# Patient Record
Sex: Female | Born: 1975 | Race: Black or African American | Hispanic: No | Marital: Single | Smoking: Current some day smoker
Health system: Southern US, Community
[De-identification: ages and names within clinical notes are randomized; demographics above are authoritative.]

## PROBLEM LIST (undated history)

## (undated) DIAGNOSIS — K859 Acute pancreatitis without necrosis or infection, unspecified: Secondary | ICD-10-CM

---

## 2004-12-21 ENCOUNTER — Emergency Department (HOSPITAL_COMMUNITY): Admission: EM | Admit: 2004-12-21 | Discharge: 2004-12-21 | Payer: Self-pay | Admitting: Emergency Medicine

## 2005-03-14 ENCOUNTER — Ambulatory Visit (HOSPITAL_COMMUNITY): Admission: RE | Admit: 2005-03-14 | Discharge: 2005-03-14 | Payer: Self-pay | Admitting: *Deleted

## 2005-04-09 ENCOUNTER — Other Ambulatory Visit: Admission: RE | Admit: 2005-04-09 | Discharge: 2005-04-09 | Payer: Self-pay | Admitting: Obstetrics and Gynecology

## 2005-08-03 ENCOUNTER — Inpatient Hospital Stay (HOSPITAL_COMMUNITY): Admission: AD | Admit: 2005-08-03 | Discharge: 2005-08-05 | Payer: Self-pay | Admitting: Obstetrics and Gynecology

## 2005-09-24 ENCOUNTER — Other Ambulatory Visit: Admission: RE | Admit: 2005-09-24 | Discharge: 2005-09-24 | Payer: Self-pay | Admitting: Obstetrics and Gynecology

## 2008-06-11 ENCOUNTER — Emergency Department (HOSPITAL_COMMUNITY): Admission: EM | Admit: 2008-06-11 | Discharge: 2008-06-11 | Payer: Self-pay | Admitting: Emergency Medicine

## 2009-01-27 ENCOUNTER — Emergency Department (HOSPITAL_COMMUNITY): Admission: EM | Admit: 2009-01-27 | Discharge: 2009-01-27 | Payer: Self-pay | Admitting: Emergency Medicine

## 2010-08-03 ENCOUNTER — Emergency Department (HOSPITAL_COMMUNITY): Admission: EM | Admit: 2010-08-03 | Discharge: 2010-08-03 | Payer: Self-pay | Admitting: Emergency Medicine

## 2011-02-23 NOTE — Op Note (Signed)
NAMEPIETRINA, JAGODZINSKI             ACCOUNT NO.:  [id number]   MEDICAL RECORD NO.:  [id number]          PATIENT TYPE:  INP   LOCATION:  9123                          FACILITY:  [facility]   PHYSICIAN:  Charles A. Delcambre, MDDATE OF BIRTH:  September 15, 1976   DATE OF PROCEDURE:  08/03/2005  DATE OF DISCHARGE:                                 OPERATIVE REPORT   This patient was admitted with Dr. Ashley Royalty today, see history and physical  as noted in the chart. A 35 year old, gravida 1, para 0 at 40 weeks and 1  day estimated gestational age, group B strep positive on penicillin,  throughout the day on pitocin after spontaneous rupture of membranes. She  was 3-4 cm dilated upon admission this morning as I recall per Dr. Ashley Royalty,  approximately 8 o'clock this morning. She had pitocin started and progressed  on slowly into labor to the active phase becoming 5-6 cm dilated by 1530.  She thereafter became 8 cm dilated by 1705 and then completed dilated by  1845. Throughout the day, she had several times where she had bradycardic  episodes down into the 80-90 range, at one point for an extended period of  time 8-10 minutes as reported to me by Dr. Ashley Royalty and then another episode  after the epidural was placed. However after recovery, fetal heart rate was  reactive and reassuring on both occasions. She had one time upon my  visualization when she had a vomiting episode of 5-6 cm at 1530 that she had  several variables. These resolved with O2. Reactive fetal heart rate was  noted once again with Pitocin off and O2. IV fluid, position changes, with  reassuring fetal heart rate once again. Pitocin was restarted. Pitocin was  advanced to __________ per minute maximum. She had adequate __________ units  greater than 200 noted and was progressed on to complete dilation as noted  above. After completion dilation at 1826, pushed to delivery at 1940.  Spontaneous vaginal delivery of vigorous female. Delivery  complicated by a  very tight nuchal cord necessitating cutting the cord prior to delivery of  the shoulders. The father of the baby was able to cut the cord quickly and  delivery was accomplished without difficulty. An intact perineum was noted,  placenta was spontaneous, three-vessel intact. Estimated blood loss less  than 300 mL, no repair was required. Mother and baby recovering stably at  this time. Apgar's were 5 and 9, baby was female and vigorous with  stimulation. Arterial blood gas was 7.24 and cord venous blood gas 7.33.      Charles A. Sydnee Cabal, MD  Electronically Signed     CAD/MEDQ  D:  08/03/2005  T:  08/04/2005  Job:  [id number]

## 2011-04-09 DIAGNOSIS — N943 Premenstrual tension syndrome: Secondary | ICD-10-CM | POA: Insufficient documentation

## 2011-04-09 DIAGNOSIS — L709 Acne, unspecified: Secondary | ICD-10-CM | POA: Insufficient documentation

## 2011-06-14 DIAGNOSIS — H6120 Impacted cerumen, unspecified ear: Secondary | ICD-10-CM | POA: Insufficient documentation

## 2011-06-21 DIAGNOSIS — A749 Chlamydial infection, unspecified: Secondary | ICD-10-CM | POA: Insufficient documentation

## 2011-06-21 DIAGNOSIS — E559 Vitamin D deficiency, unspecified: Secondary | ICD-10-CM | POA: Insufficient documentation

## 2011-06-21 DIAGNOSIS — A5909 Other urogenital trichomoniasis: Secondary | ICD-10-CM | POA: Insufficient documentation

## 2014-06-09 ENCOUNTER — Emergency Department (HOSPITAL_COMMUNITY): Payer: Medicaid Other

## 2014-06-09 ENCOUNTER — Emergency Department (HOSPITAL_COMMUNITY)
Admission: EM | Admit: 2014-06-09 | Discharge: 2014-06-09 | Disposition: A | Discharge status: Home / Self Care | Payer: Medicaid Other | Attending: Emergency Medicine | Admitting: Emergency Medicine

## 2014-06-09 ENCOUNTER — Encounter (HOSPITAL_COMMUNITY): Payer: Self-pay | Admitting: Emergency Medicine

## 2014-06-09 DIAGNOSIS — M545 Low back pain, unspecified: Secondary | ICD-10-CM | POA: Diagnosis not present

## 2014-06-09 DIAGNOSIS — Z791 Long term (current) use of non-steroidal anti-inflammatories (NSAID): Secondary | ICD-10-CM | POA: Diagnosis not present

## 2014-06-09 DIAGNOSIS — Z3202 Encounter for pregnancy test, result negative: Secondary | ICD-10-CM | POA: Insufficient documentation

## 2014-06-09 DIAGNOSIS — M5489 Other dorsalgia: Secondary | ICD-10-CM

## 2014-06-09 LAB — URINALYSIS, ROUTINE W REFLEX MICROSCOPIC
Bilirubin Urine: NEGATIVE
Glucose, UA: NEGATIVE mg/dL
Hgb urine dipstick: NEGATIVE
Ketones, ur: NEGATIVE mg/dL
Leukocytes, UA: NEGATIVE
Nitrite: NEGATIVE
Protein, ur: NEGATIVE mg/dL
Specific Gravity, Urine: 1.02 (ref 1.005–1.030)
Urobilinogen, UA: 0.2 mg/dL (ref 0.0–1.0)
pH: 7.5 (ref 5.0–8.0)

## 2014-06-09 LAB — PREGNANCY, URINE: Preg Test, Ur: NEGATIVE

## 2014-06-09 MED ORDER — NAPROXEN 500 MG PO TABS: 500.0000 mg | ORAL_TABLET | Freq: Two times a day (BID) | ORAL | Status: DC | PRN

## 2014-06-09 NOTE — ED Notes (Signed)
Patient states she has had lower Left back pain for the last month with sudden onset. Patient states she has been using pillows to sleep at night. Patient denise taking any mediations for the pain. Patient states her pain was 7/10. Patient states no loss of bowel or bladder.

## 2014-06-09 NOTE — ED Notes (Addendum)
Pt threatening to leave because she is "cold and tried of waiting."  Pt educated on delay, given a warm blanket, and agreed to go back to room.  Reports urine sample is in her room.  RN notified.

## 2014-06-09 NOTE — Discharge Instructions (Signed)
Your back pain could be musculoskeletal or gastrointestinal. Use naprosyn as directed to help with pain, and find a regular doctor to have ongoing management of your back pain. Return to the ER for changes or worsening symptoms.   Pain of Unknown Etiology (Pain Without a Known Cause) You have come to your caregiver because of pain. Pain can occur in any part of the body. Often there is not a definite cause. If your laboratory (blood or urine) work was normal and X-rays or other studies were normal, your caregiver may treat you without knowing the cause of the pain. An example of this is the headache. Most headaches are diagnosed by taking a history. This means your caregiver asks you questions about your headaches. Your caregiver determines a treatment based on your answers. Usually testing done for headaches is normal. Often testing is not done unless there is no response to medications. Regardless of where your pain is located today, you can be given medications to make you comfortable. If no physical cause of pain can be found, most cases of pain will gradually leave as suddenly as they came.  If you have a painful condition and no reason can be found for the pain, it is important that you follow up with your caregiver. If the pain becomes worse or does not go away, it may be necessary to repeat tests and look further for a possible cause.  Only take over-the-counter or prescription medicines for pain, discomfort, or fever as directed by your caregiver.  For the protection of your privacy, test results cannot be given over the phone. Make sure you receive the results of your test. Ask how these results are to be obtained if you have not been informed. It is your responsibility to obtain your test results.  You may continue all activities unless the activities cause more pain. When the pain lessens, it is important to gradually resume normal activities. Resume activities by beginning slowly and  gradually increasing the intensity and duration of the activities or exercise. During periods of severe pain, bed rest may be helpful. Lie or sit in any position that is comfortable.  Ice used for acute (sudden) conditions may be effective. Use a large plastic bag filled with ice and wrapped in a towel. This may provide pain relief.  See your caregiver for continued problems. Your caregiver can help or refer you for exercises or physical therapy if necessary. If you were given medications for your condition, do not drive, operate machinery or power tools, or sign legal documents for 24 hours. Do not drink alcohol, take sleeping pills, or take other medications that may interfere with treatment. See your caregiver immediately if you have pain that is becoming worse and not relieved by medications. Document Released: 06/19/2001 Document Revised: 07/15/2013 Document Reviewed: 09/24/2005 [facility] Patient Information 2015 [place], [place]. This information is not intended to replace advice given to you by your health care provider. Make sure you discuss any questions you have with your health care provider.  Musculoskeletal Pain Musculoskeletal pain is muscle and boney aches and pains. These pains can occur in any part of the body. Your caregiver may treat you without knowing the cause of the pain. They may treat you if blood or urine tests, X-rays, and other tests were normal.  CAUSES There is often not a definite cause or reason for these pains. These pains may be caused by a type of germ (virus). The discomfort may also come from overuse. Overuse includes  working out too hard when your body is not fit. Boney aches also come from weather changes. Bone is sensitive to atmospheric pressure changes. HOME CARE INSTRUCTIONS   Ask when your test results will be ready. Make sure you get your test results.  Only take over-the-counter or prescription medicines for pain, discomfort, or fever as directed by your  caregiver. If you were given medications for your condition, do not drive, operate machinery or power tools, or sign legal documents for 24 hours. Do not drink alcohol. Do not take sleeping pills or other medications that may interfere with treatment.  Continue all activities unless the activities cause more pain. When the pain lessens, slowly resume normal activities. Gradually increase the intensity and duration of the activities or exercise.  During periods of severe pain, bed rest may be helpful. Lay or sit in any position that is comfortable.  Putting ice on the injured area.  Put ice in a bag.  Place a towel between your skin and the bag.  Leave the ice on for 15 to 20 minutes, 3 to 4 times a day.  Follow up with your caregiver for continued problems and no reason can be found for the pain. If the pain becomes worse or does not go away, it may be necessary to repeat tests or do additional testing. Your caregiver may need to look further for a possible cause. SEEK IMMEDIATE MEDICAL CARE IF:  You have pain that is getting worse and is not relieved by medications.  You develop chest pain that is associated with shortness or breath, sweating, feeling sick to your stomach (nauseous), or throw up (vomit).  Your pain becomes localized to the abdomen.  You develop any new symptoms that seem different or that concern you. MAKE SURE YOU:   Understand these instructions.  Will watch your condition.  Will get help right away if you are not doing well or get worse. Document Released: 09/24/2005 Document Revised: 12/17/2011 Document Reviewed: 05/29/2013 [facility] Patient Information 2015 [place], [place]. This information is not intended to replace advice given to you by your health care provider. Make sure you discuss any questions you have with your health care provider.  Back Pain, Adult Low back pain is very common. About 1 in 5 people have back pain.The cause of low back pain is rarely  dangerous. The pain often gets better over time.About half of people with a sudden onset of back pain feel better in just 2 weeks. About 8 in 10 people feel better by 6 weeks.  CAUSES Some common causes of back pain include:  Strain of the muscles or ligaments supporting the spine.  Wear and tear (degeneration) of the spinal discs.  Arthritis.  Direct injury to the back. DIAGNOSIS Most of the time, the direct cause of low back pain is not known.However, back pain can be treated effectively even when the exact cause of the pain is unknown.Answering your caregiver's questions about your overall health and symptoms is one of the most accurate ways to make sure the cause of your pain is not dangerous. If your caregiver needs more information, he or she may order lab work or imaging tests (X-rays or MRIs).However, even if imaging tests show changes in your back, this usually does not require surgery. HOME CARE INSTRUCTIONS For many people, back pain returns.Since low back pain is rarely dangerous, it is often a condition that people can learn to [facility] their own.   Remain active. It is stressful on the  back to sit or stand in one place. Do not sit, drive, or stand in one place for more than 30 minutes at a time. Take short walks on level surfaces as soon as pain allows.Try to increase the length of time you walk each day.  Do not stay in bed.Resting more than 1 or 2 days can delay your recovery.  Do not avoid exercise or work.Your body is made to move.It is not dangerous to be active, even though your back may hurt.Your back will likely heal faster if you return to being active before your pain is gone.  Pay attention to your body when you bend and lift. Many people have less discomfortwhen lifting if they bend their knees, keep the load close to their bodies,and avoid twisting. Often, the most comfortable positions are those that put less stress on your recovering back.  Find a  comfortable position to sleep. Use a firm mattress and lie on your side with your knees slightly bent. If you lie on your back, put a pillow under your knees.  Only take over-the-counter or prescription medicines as directed by your caregiver. Over-the-counter medicines to reduce pain and inflammation are often the most helpful.Your caregiver may prescribe muscle relaxant drugs.These medicines help dull your pain so you can more quickly return to your normal activities and healthy exercise.  Put ice on the injured area.  Put ice in a plastic bag.  Place a towel between your skin and the bag.  Leave the ice on for 15-20 minutes, 03-04 times a day for the first 2 to 3 days. After that, ice and heat may be alternated to reduce pain and spasms.  Ask your caregiver about trying back exercises and gentle massage. This may be of some benefit.  Avoid feeling anxious or stressed.Stress increases muscle tension and can worsen back pain.It is important to recognize when you are anxious or stressed and learn ways to manage it.Exercise is a great option. SEEK MEDICAL CARE IF:  You have pain that is not relieved with rest or medicine.  You have pain that does not improve in 1 week.  You have new symptoms.  You are generally not feeling well. SEEK IMMEDIATE MEDICAL CARE IF:   You have pain that radiates from your back into your legs.  You develop new bowel or bladder control problems.  You have unusual weakness or numbness in your arms or legs.  You develop nausea or vomiting.  You develop abdominal pain.  You feel faint. Document Released: 09/24/2005 Document Revised: 03/25/2012 Document Reviewed: 01/26/2014 [facility] Patient Information 2015 [place], [place]. This information is not intended to replace advice given to you by your health care provider. Make sure you discuss any questions you have with your health care provider.  Emergency Department Resource Guide 1) Find a Doctor and  Pay Out of Pocket Although you won't have to find out who is covered by your insurance plan, it is a good idea to ask around and get recommendations. You will then need to call the office and see if the doctor you have chosen will accept you as a new patient and what types of options they offer for patients who are self-pay. Some doctors offer discounts or will set up payment plans for their patients who do not have insurance, but you will need to ask so you aren't surprised when you get to your appointment.  2) Contact Your Local Health Department Not all health departments have doctors that can see patients  for sick visits, but many do, so it is worth a call to see if yours does. If you don't know where your local health department is, you can check in your phone book. The CDC also has a tool to help you locate your state's health department, and many state websites also have listings of all of their local health departments.  3) Find a Walk-in Clinic If your illness is not likely to be very severe or complicated, you may want to try a walk in clinic. These are popping up all over the country in pharmacies, drugstores, and shopping centers. They're usually staffed by nurse practitioners or physician assistants that have been trained to treat common illnesses and complaints. They're usually fairly quick and inexpensive. However, if you have serious medical issues or chronic medical problems, these are probably not your best option.  No Primary Care Doctor: - Call Health Connect at  [phone] - they can help you locate a primary care doctor that  accepts your insurance, provides certain services, etc. - Physician Referral Service- [phone]  Chronic Pain Problems: Organization         Address  Phone   Notes  [facility]  [phone] Patients need to be referred by their primary care doctor.   Medication Assistance: Organization         Address  Phone   Notes  [facility] Medication [facility] [address] [place] [place], [place] [address] [phone] --Must be a resident of [facility] -- Must have NO insurance coverage whatsoever (no Medicaid/ Medicare, etc.) -- The pt. MUST have a primary care doctor that directs their care regularly and follows them in the community   MedAssist  [phone]   [employer]  [phone]    Agencies that provide inexpensive medical care: Organization         Address  Phone   Notes  [facility]  [phone]   [facility]    [phone]   [facility] [address] [place], [place] [address] [phone]   [facility] [address] [place], [place] [phone]   Planned Parenthood    [phone]   [facility]    [phone]   [facility]  [address], [place] Phone:  [phone], Fax:  [phone] Hours of Operation:  9 am - 6 pm, M-F.  Also accepts Medicaid/Medicare and self-pay.  [facility]  [address], [place] Phone: [phone], Fax: [phone]. Hours of Operation:  8:30 am - 5:30 pm, M-F.  Also accepts Medicaid and self-pay.  [facility] [place] [address]ello, [place] Phone: [phone]   [facility] [address] Natasha Bence [place], [place] [phone], Ext. [phone] Mondays & Thursdays: 7-9 AM.  First 15 patients are seen on a first come, first serve basis.    Medicaid-accepting [facility] Providers:  Organization         Address  Phone   Notes  [facility] [address], [place] [phone] Also accepts self-pay patients.  [facility] [address] Laurell Josephs [place], [place]  [phone]   [facility] [address], [place] [phone]   [facility] [address]ara [place], [place] [phone])  [phone]   Renaye Rakers [address], [place]   [phone] Only accepts [place] patients after they have their name applied to their card.   Self-Pay (no insurance) in [facility]:  Organization         Address  Phone   Notes  Sickle Cell Patients, [facility] [address] [place], [place] [phone]   [facility] [address] [place], [place] [phone]   [facility] [place]  [address], [place] [phone]   [facility]/Dr. Osei-Bonsu  [address], [place] or [address], [place] [phone] Phone number for both [place] and [place] locations is the same.  [facility] [address] [place] [phone]   [facility] [address]arya [place], [place] or [address] [phone]   [facility] [address] [place] [phone], phone; [phone], fax Sees patients 1st and 3rd Saturday of every month.  Must not qualify for public or private insurance (i.e. Medicaid, Medicare, [place] Health Choice, Veterans' Benefits)  Household income should be no more than 200% of the poverty level The clinic cannot treat you if you are pregnant or think you are pregnant  Sexually transmitted diseases are not treated at the clinic.    Dental Care: Organization         Address  Phone  Notes  [facility] Department of [facility] [address] [place], [place] [phone] Accepts children up to age 64 who are enrolled in [place] or [place] Health Choice; pregnant women with a Medicaid card; and children who have applied for Medicaid or [employer] Health Choice, but were declined, whose parents can pay a reduced fee at time of service.  [facility] Department of [facility]  [address], [place] [phone] Accepts children up to age 6 who are enrolled in [place] or [place] Health Choice; pregnant women with a Medicaid card; and children who have applied for Medicaid or [place] Health Choice, but were declined, whose parents can pay a reduced fee at time of service.  [facility]  [address] [place], [place] [phone] Patients are seen by appointment only. Walk-ins are not accepted. [facility] will see patients 59 years of age and older. Monday - Tuesday (8am-5pm) Most Wednesdays (8:30-5pm) $30 per visit, cash only  [facility]  [address], [facility] [phone] Patients are seen by appointment only. Walk-ins are not accepted. [facility] will see patients 63 years of age and older. One Wednesday Evening (Monthly: Volunteer Based).  $30 per visit, cash only  [employer]  [phone] for adults; Children under age 35, call Graduate Pediatric Dentistry at [phone]. Children aged 17-14, please call [phone] to request a pediatric application.  Dental services are provided in all areas of dental care including fillings, crowns and bridges, complete and partial dentures, implants, gum treatment, root canals, and extractions. Preventive care is also provided. Treatment is provided to both adults and children. Patients are selected via a lottery and there is often a waiting list.   [facility] [address] [place]  [phone] [email or web address]   [facility] [address] [place], [place] (  [phone], Ext. [phone] Second and Fourth Thursday of each month, opens at 6:30 AM; Clinic ends at 9 AM.  Patients are seen on a first-come first-served basis, and a limited number are seen during each clinic.   [facility]  [address] Ether Griffins [place], [place] [phone]   Eligibility Requirements You must have lived in [place], [place], or  [place] counties for at least the last three months.   You cannot be eligible for state or federal sponsored [place], including [employer], [place], or [employer].   You generally cannot be eligible for healthcare insurance through your employer.    How to apply: Eligibility screenings are held every Tuesday and Wednesday afternoon from 1:00 pm until 4:00 pm. You do not need an appointment for the interview!  [facility] [address]rica Thorman, [place], [place] [phone]   [facility] Health Department  [phone]   [facility] Health Department  [phone]   [facility] Health Department  [phone]    Behavioral Health Resources in the Community: Intensive Outpatient Programs Organization         Address  Phone  Notes  [facility] [address]az [address], [place], [place] [phone]   [facility] [address], [place], [place] [phone]   [facility] [address], [place], [place]  [phone]   [facility] Mental Health [address]  [place], [place] [phone] or [phone]   Substance Abuse Resources Organization         Address  Phone  Notes  [facility]  [phone]   [facility]  [phone]   [facility] [place]  [phone]   Floydene Flock  [phone]   Residential & Outpatient Substance Abuse Program  [phone]   Psychological Services Organization         Address  Phone  Notes  [facility]  [phone]   [facility]  [phone]   [facility] [address] [place] [phone] or [phone]    Mobile Crisis Teams Organization         Address  Phone  Notes  [facility], Mobile Crisis Care Unit  [phone]   [facility]  [address] [place], [place] [phone]   Doristine Locks [address] [place] [phone]    Self-Help/Support Groups Organization         Address  Phone             Notes  [facility] [place] - variety of support groups  336- I7437963 Call for more information  Narcotics Anonymous (NA), [facility] [address], [employer] [place]  2 meetings at this location   Statistician         Address  Phone  Notes  [facility] [address],    [place]  [phone]   [facility]  [address] [place] [address], [place], [place] [phone]   [facility] [address] [place], [place] [phone] Admissions: 8am-3pm M-F  [facility] [address],    [place], [place] [phone]   [facility] [address] Starling Manns [place], [place] [phone]   [facility] [address]  [place], [place] [phone]   [facility] - Intensive Outpatient [address]ure[address], [place], [place] [phone]   [facility] (Addiction  [facility] [address]  [place], [place] [phone] or [phone]   [facility] ([facility]) [address] [place], [place] [phone] Accepts Medicaid  [facility] [place] [address]  [place] [phone] Substance Abuse/Addiction Treatment   [facility] Organization         Address  Phone  Notes  [facility]  [phone]   Angie Fava, PhD [address] [place], [place]   [phone] or [phone]   [facility] Behavioral   [address] [place], [place] [phone]   [facility] [address], [place], [place] [phone] Insurance/Medicaid/sponsorship through [facility] [address]                                    [place], [place] [phone] Therapy/tele-psych/case  [facility] [address], [place] [phone]     Dr. Lolly Mustache  [phone]   [facility]  [employer] [facility] Dept. 1) [address] [place], [place] 2) [address] [place] 3)  [address], [place] [phone]  [phone]   [facility] Child Abuse Hotline 949-654-6679 or [phone] (After Hours)

## 2014-06-09 NOTE — ED Provider Notes (Signed)
CSN: [id number]     Arrival date & time 06/09/14  1352 History  This chart was scribed for non-physician practitioner, Allen Derry, PA-C working with Purvis Sheffield, MD by Greggory Stallion, ED scribe. This patient was seen in room WTR9/WTR9 and the patient's care was started at 3:07 PM.    Chief Complaint  Patient presents with  . Back Pain   Patient is a 38 y.o. female presenting with back pain. The history is provided by the patient. No language interpreter was used.  Back Pain Location:  Lumbar spine Quality: squeezing. Radiates to:  Does not radiate Pain severity:  Moderate Worse during: worse before bowel movement. Onset quality:  Sudden Duration:  1 month Timing:  Constant Progression:  Worsening Chronicity:  New Relieved by: pillow. Worsened by:  Sitting Ineffective treatments:  None tried Associated symptoms: no abdominal pain, no chest pain, no dysuria, no fever and no numbness    HPI Comments: Sydney Dalton is a 38 y.o. female who presents to the Emergency Department complaining of worsening left lower back pain that started one month ago. Denies injury. Rates pain 7/10 and describes it as squeezing. Pain does not radiate. Sitting down worsens the pain. Reports darker urine and states pain is worse before she has to have a bowel movement. She has bowel movements twice per day everyday. Pt has not taken anything for her symptoms but states she uses a pillow behind her back with some relief. Denies fever, unexpected weight loss, chest pain, SOB, abdominal pain, nausea, emesis, diarrhea, constipation, blood in stool, black tarry stool, dysuria, hematuria, vaginal discharge, vaginal bleeding, numbness or tingling in extremities or cauda equina symptoms. LMP was 05/24/14.  History reviewed. No pertinent past medical history. History reviewed. No pertinent past surgical history. No family history on file. History  Substance Use Topics  . Smoking status: Never Smoker    . Smokeless tobacco: Not on file  . Alcohol Use: Yes   OB History   Grav Para Term Preterm Abortions TAB SAB Ect Mult Living                 Review of Systems  Constitutional: Negative for fever and unexpected weight change.  Respiratory: Negative for shortness of breath.   Cardiovascular: Negative for chest pain.  Gastrointestinal: Negative for nausea, vomiting, abdominal pain, diarrhea, constipation and blood in stool.  Genitourinary: Negative for dysuria, hematuria, vaginal bleeding and vaginal discharge.  Musculoskeletal: Positive for back pain.  Neurological: Negative for numbness.  10 Systems reviewed and are negative for acute change except as noted in the HPI.  Allergies  Review of patient's allergies indicates no known allergies.  Home Medications   Prior to Admission medications   Medication Sig Start Date End Date Taking? Authorizing Provider  naproxen (NAPROSYN) 500 MG tablet Take 1 tablet (500 mg total) by mouth 2 (two) times daily as needed for mild pain, moderate pain or headache (TAKE WITH MEALS.). 06/09/14   Donnita Falls Camprubi-Soms, PA-C   BP 114/85  Pulse 76  Temp(Src) 98.5 F (36.9 C) (Oral)  Resp 16  Ht  (1.651 m)  Wt 160 lb (72.576 kg)  BMI 26.63 kg/m2  SpO2 100%  LMP 05/24/2014  Physical Exam  Nursing note and vitals reviewed. Constitutional: She is oriented to person, place, and time. Vital signs are normal. She appears well-developed and well-nourished. No distress.  Afebrile, nontoxic  HENT:  Head: Normocephalic and atraumatic.  Mouth/Throat: Mucous membranes are normal.  Eyes: Conjunctivae and  EOM are normal.  Neck: Normal range of motion. Neck supple. No spinous process tenderness and no muscular tenderness present. No rigidity. Normal range of motion present.  FROM without TTP  Cardiovascular: Normal rate, regular rhythm, normal heart sounds and intact distal pulses.  Exam reveals no gallop and no friction rub.   No murmur  heard. Pulmonary/Chest: Effort normal and breath sounds normal. No respiratory distress. She has no decreased breath sounds. She has no wheezes. She has no rhonchi. She has no rales.  Abdominal: Soft. Normal appearance and bowel sounds are normal. She exhibits no distension. There is no splenomegaly. There is no tenderness. There is no rigidity, no rebound, no guarding, no CVA tenderness, no tenderness at McBurney's point and negative Murphy's sign.  Soft, NT/ND, +BS throughout, no r/g/r, neg murphy's, neg mcburney's, no splenomegaly palpated  Musculoskeletal: Normal range of motion.       Back:  MAE x4. All spinal levels nonTTP without paraspinous muscle TTP. Sensation and strength at baseline. Gait nonataxic and nonantalgic. No area of reproducible pain. No deformity or swelling to skin over painful area  Neurological: She is alert and oriented to person, place, and time. She has normal strength. No sensory deficit. Gait normal.  Skin: Skin is warm, dry and intact. No rash noted.  Psychiatric: She has a normal mood and affect. Her behavior is normal.  10 Systems reviewed and are negative for acute change except as noted in the HPI.   ED Course  Procedures (including critical care time)  DIAGNOSTIC STUDIES: Oxygen Saturation is 100% on RA, normal by my interpretation.    COORDINATION OF CARE: 3:14 PM-Discussed treatment plan which includes UA and xray with pt at bedside and pt agreed to plan.   Labs Review Labs Reviewed  URINALYSIS, ROUTINE W REFLEX MICROSCOPIC - Abnormal; Notable for the following:    APPearance CLOUDY (*)    All other components within normal limits  PREGNANCY, URINE    Imaging Review No results found.   EKG Interpretation None      MDM   Final diagnoses:  Other back pain    38y/o female with lower back pain x1 month. Doesn't seem musculoskeletal, is not reproducible. No GI complaint, but could be constipation or gas, although story doesn't completely  fit. Could be urological. Discussed obtaining U/A, upreg, and xray to eval stool burden and eval for obstruction although low likelihood. U/A showing no blood, upreg neg. Xray was very backed up today, and after several hours of waiting pt decided to leave. Discussed f/up with a PCP given that this was an unclear etiology but I didn't believe it needed further work up today. She was agreeable with this plan. Given naprosyn for pain. Stable at time of d/c. Given return precautions.  I personally performed the services described in this documentation, which was scribed in my presence. The recorded information has been reviewed and is accurate.  BP 114/85  Pulse 76  Temp(Src) 98.5 F (36.9 C) (Oral)  Resp 16  Ht  (1.651 m)  Wt 160 lb (72.576 kg)  BMI 26.63 kg/m2  SpO2 100%  LMP 05/24/2014  Meds ordered this encounter  Medications  . naproxen (NAPROSYN) 500 MG tablet    Sig: Take 1 tablet (500 mg total) by mouth 2 (two) times daily as needed for mild pain, moderate pain or headache (TAKE WITH MEALS.).    Dispense:  20 tablet    Refill:  0    Order Specific Question:  Supervising Provider    Answer:  Eber Hong D [address] [place], [place] 06/09/14 [phone]

## 2014-06-10 NOTE — ED Provider Notes (Signed)
Medical screening examination/treatment/procedure(s) were performed by non-physician practitioner and as supervising physician I was immediately available for consultation/collaboration.   EKG Interpretation None        Purvis Sheffield, MD 06/10/14 [phone]

## 2016-02-29 ENCOUNTER — Emergency Department (HOSPITAL_COMMUNITY): Payer: Self-pay

## 2016-02-29 ENCOUNTER — Emergency Department (HOSPITAL_COMMUNITY)
Admission: EM | Admit: 2016-02-29 | Discharge: 2016-02-29 | Disposition: A | Discharge status: Home / Self Care | Payer: Self-pay | Attending: Emergency Medicine | Admitting: Emergency Medicine

## 2016-02-29 ENCOUNTER — Encounter (HOSPITAL_COMMUNITY): Payer: Self-pay | Admitting: Emergency Medicine

## 2016-02-29 DIAGNOSIS — R519 Headache, unspecified: Secondary | ICD-10-CM

## 2016-02-29 DIAGNOSIS — Y999 Unspecified external cause status: Secondary | ICD-10-CM | POA: Insufficient documentation

## 2016-02-29 DIAGNOSIS — Y929 Unspecified place or not applicable: Secondary | ICD-10-CM | POA: Insufficient documentation

## 2016-02-29 DIAGNOSIS — Y93K1 Activity, walking an animal: Secondary | ICD-10-CM | POA: Insufficient documentation

## 2016-02-29 DIAGNOSIS — S022XXA Fracture of nasal bones, initial encounter for closed fracture: Secondary | ICD-10-CM

## 2016-02-29 DIAGNOSIS — R51 Headache: Secondary | ICD-10-CM

## 2016-02-29 DIAGNOSIS — W0110XA Fall on same level from slipping, tripping and stumbling with subsequent striking against unspecified object, initial encounter: Secondary | ICD-10-CM | POA: Insufficient documentation

## 2016-02-29 LAB — POC URINE PREG, ED: PREG TEST UR: NEGATIVE

## 2016-02-29 NOTE — ED Notes (Signed)
Pt c/o swelling to her nose and headache since yesterday after tripping and falling on the stairs. Denies LOC and hitting her head.

## 2016-02-29 NOTE — Discharge Instructions (Signed)
Obtained a primary care provider and follow-up with your primary care provider within 2 days to have your nose reevaluated.   Return to the emergency department if you experience worsening headache, visual changes, nausea, vomiting, weakness or numbness.  Nasal Fracture A nasal fracture is a break or crack in the bones or cartilage of the nose. Minor breaks do not require treatment. These breaks usually heal on their own after about one month. Serious breaks may require surgery. CAUSES This injury is usually caused by a blunt injury to the nose. This type of injury often occurs from:  Contact sports.  Car accidents.  Falls.  Getting punched. SYMPTOMS Symptoms of this injury include:  Pain.  Swelling of the nose.  Bleeding from the nose.  Bruising around the nose or eyes. This may include having black eyes.  Crooked appearance of the nose. DIAGNOSIS This injury may be diagnosed with a physical exam. The health care provider will gently feel the nose for signs of broken bones. He or she will look inside the nostrils to make sure that there is not a blood-filled swelling on the dividing wall between the nostrils (septal hematoma). X-rays of the nose may not show a nasal fracture even when one is present. In some cases, X-rays or a CT scan may be done 1-5 days after the injury. Sometimes, the health care provider will want to wait until the swelling has gone down. TREATMENT Often, minor fractures that have caused no deformity do not require treatment. More serious fractures in which bones have moved out of position may require surgery, which will take place after the swelling is gone. Surgery will stabilize and align the fracture. In some cases, a health care provider may be able to reposition the bones without surgery. This may be done in the health care provider's office after medicine is given to numb the area (local anesthetic). HOME CARE INSTRUCTIONS  If directed, apply ice to the  injured area:  Put ice in a plastic bag.  Place a towel between your skin and the bag.  Leave the ice on for 20 minutes, 2-3 times per day.  Take over-the-counter and prescription medicines only as told by your health care provider.  If your nose starts to bleed, sit in an upright position while you squeeze the soft parts of your nose against the dividing wall between your nostrils (septum) for 10 minutes.  Try to avoid blowing your nose.  Return to your normal activities as told by your health care provider. Ask your health care provider what activities are safe for you.  Avoid contact sports for 3-4 weeks or as told by your health care provider.  Keep all follow-up visits as told by your health care provider. This is important. SEEK MEDICAL CARE IF:  Your pain increases or becomes severe.  You continue to have nosebleeds.  The shape of your nose does not return to normal within 5 days.  You have pus draining out of your nose. SEEK IMMEDIATE MEDICAL CARE IF:  You have bleeding from your nose that does not stop after you pinch your nostrils closed for 20 minutes and keep ice on your nose.  You have clear fluid draining out of your nose.  You notice a grape-like swelling on the septum. This swelling is a collection of blood (hematoma) that must be drained to help prevent infection.  You have difficulty moving your eyes.  You have repeated vomiting.   This information is not intended to replace  advice given to you by your health care provider. Make sure you discuss any questions you have with your health care provider.   Document Released: 09/21/2000 Document Revised: 06/15/2015 Document Reviewed: 11/01/2014 Elsevier Interactive Patient Education [employer]

## 2016-02-29 NOTE — ED Provider Notes (Signed)
CSN: [id number]     Arrival date & time 02/29/16  1107 History   First MD Initiated Contact with Patient 02/29/16 1141     Chief Complaint  Patient presents with  . Headache     (Consider location/radiation/quality/duration/timing/severity/associated sxs/prior Treatment) HPI   Patient is a [age] old female with no medical history who presents to the ED with nose pain. Patient states that last evening around 7 PM she was walking her dogs when they pulled her up the stairs she fell and hit her face on a step. She was unable to brace herself with her hands. She had a nosebleed for roughly 10 minutes. She has tried ice with little relief. She complains of a constant frontal headache, pressure, constant, 8/10 with associated eye pressure and pain. She is not taking anything for the pain. She states her nose is much more swollen today. She denies visual changes, numbness/tingling, weakness, nausea, vomiting, syncope, loss of consciousness.  History reviewed. No pertinent past medical history. History reviewed. No pertinent past surgical history. History reviewed. No pertinent family history. Social History  Substance Use Topics  . Smoking status: Never Smoker   . Smokeless tobacco: None  . Alcohol Use: Yes   OB History    No data available     Review of Systems  Constitutional: Negative for fever and chills.  HENT: Positive for facial swelling and nosebleeds. Negative for ear discharge, ear pain, rhinorrhea, sinus pressure, sore throat, tinnitus, trouble swallowing and voice change.   Eyes: Positive for pain. Negative for discharge, itching and visual disturbance.  Respiratory: Negative for chest tightness and shortness of breath.   Cardiovascular: Negative for chest pain.  Gastrointestinal: Negative for nausea, vomiting and abdominal pain.  Musculoskeletal: Negative for gait problem and neck pain.  Neurological: Positive for headaches. Negative for dizziness, syncope, weakness and  numbness.      Allergies  Review of patient's allergies indicates no known allergies.  Home Medications   Prior to Admission medications   Medication Sig Start Date End Date Taking? Authorizing Provider  Probiotic Product (PROBIOTIC PO) Take 1 capsule by mouth daily.   Yes Historical Provider, MD   BP 106/85 mmHg  Pulse 80  Temp(Src) 98.3 F (36.8 C) (Oral)  Resp 14  SpO2 98%  LMP 02/06/2016 (Approximate) Physical Exam   Constitutional: Pt is oriented to person, place, and time. Pt appears well-developed and well-nourished. No distress.  HENT:  Head: Normocephalic no contusions, erythema, ecchymosis noted.  Nose: Swelling noted to bridge of nose, turbinates are edematous and erythematous, no discharge noted. TTP Mouth/Throat: Oropharynx is clear and moist.  Eyes: Conjunctivae and EOM are normal. Pupils are equal, round, and reactive to light. No scleral icterus TTP surround orbit  No horizontal, vertical or rotational nystagmus  Neck: Normal range of motion. Neck supple.  Full active and passive ROM without pain No midline or paraspinal tenderness Cardiovascular: Normal rate, regular rhythm and intact distal pulses.   Pulmonary/Chest: Effort normal and breath sounds normal. No respiratory distress. Pt has no wheezes. No rales.  Abdominal: Soft. There is no tenderness. There is no rebound and no guarding.  Musculoskeletal: Normal range of motion.  Neurological: Pt. is alert and oriented to person, place, and time.No cranial nerve deficit.  Exhibits normal muscle tone. Coordination normal.  Mental Status:  Alert, oriented, thought content appropriate. Speech fluent without evidence of aphasia. Able to follow 2 step commands without difficulty.  Cranial Nerves:  II:  Peripheral visual fields grossly normal,  pupils equal, round, reactive to light III,IV, VI: ptosis not present, extra-ocular motions intact bilaterally  V,VII: smile symmetric, facial light touch sensation  equal VIII: hearing grossly normal bilaterally  IX,X: midline uvula rise  XI: bilateral shoulder shrug equal and strong XII: midline tongue extension  Motor:  5/5 in upper and lower extremities bilaterally including strong and equal grip strength and dorsiflexion/plantar flexion Sensory: light touch normal in all extremities.  Cerebellar: normal finger-to-nose with bilateral upper extremities, pronator drift negative  Gait: normal gait and balance Skin: Skin is warm and dry. No rash noted. Pt is not diaphoretic.  Psychiatric: Pt has a normal mood and affect. Behavior is normal. Judgment and thought content normal.  Nursing note and vitals reviewed.    ED Course  Procedures (including critical care time) Labs Review Labs Reviewed  POC URINE PREG, ED    Imaging Review Ct Head Wo Contrast  02/29/2016  CLINICAL DATA:  Fall onto the nose with swelling. Injury yesterday related to trip on stairs. Headache Initial encounter. EXAM: CT HEAD WITHOUT CONTRAST CT MAXILLOFACIAL WITHOUT CONTRAST TECHNIQUE: Multidetector CT imaging of the head and maxillofacial structures were performed using the standard protocol without intravenous contrast. Multiplanar CT image reconstructions of the maxillofacial structures were also generated. COMPARISON:  01/27/2009 face CT FINDINGS: CT HEAD FINDINGS Skull:Negative for calvarial fracture. Facial findings described below. Brain: Negative. No evidence of acute infarction, hemorrhage, hydrocephalus, or mass lesion/mass effect. CT MAXILLOFACIAL FINDINGS There is a minimally depressed right nasal bone fracture that is newly seen. Leftward septal deviation with left anterior nasal cavity stenosis is chronic and without visible septal fracture. The mandible is intact and located. No hemo sinus. No evidence of globe injury or postseptal hematoma. Subcutaneous nodule in the right cheek, usually dermal inclusion cyst. Carious maxillary and mandibular molars. The lower right  terminal molar has a notable periapical erosion. IMPRESSION: 1. Minimally depressed right nasal bone fracture. 2. No evidence of intracranial injury. 3. 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 Electronically Signed   By: Marnee Spring M.D.   On: 02/29/2016 13:19   Ct Maxillofacial Wo Cm  02/29/2016  CLINICAL DATA:  Fall onto the nose with swelling. Injury yesterday related to trip on stairs. Headache Initial encounter. EXAM: CT HEAD WITHOUT CONTRAST CT MAXILLOFACIAL WITHOUT CONTRAST TECHNIQUE: Multidetector CT imaging of the head and maxillofacial structures were performed using the standard protocol without intravenous contrast. Multiplanar CT image reconstructions of the maxillofacial structures were also generated. COMPARISON:  01/27/2009 face CT FINDINGS: CT HEAD FINDINGS Skull:Negative for calvarial fracture. Facial findings described below. Brain: Negative. No evidence of acute infarction, hemorrhage, hydrocephalus, or mass lesion/mass effect. CT MAXILLOFACIAL FINDINGS There is a minimally depressed right nasal bone fracture that is newly seen. Leftward septal deviation with left anterior nasal cavity stenosis is chronic and without visible septal fracture. The mandible is intact and located. No hemo sinus. No evidence of globe injury or postseptal hematoma. Subcutaneous nodule in the right cheek, usually dermal inclusion cyst. Carious maxillary and mandibular molars. The lower right terminal molar has a notable periapical erosion. IMPRESSION: 1. Minimally depressed right nasal bone fracture. 2. No evidence of intracranial injury. 3. 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 Electronically Signed   By: Marnee Spring M.D.   On: 02/29/2016 13:19   I have personally reviewed and evaluated these images and lab results as part of my medical decision-making.   EKG Interpretation None      MDM  Final diagnoses:  Nasal bone fracture, closed, initial  encounter  Nonintractable headache, unspecified chronicity pattern, unspecified headache type   Patient presents after a fall yesterday with nose pain and headache. Patient fell on her nose. No focal neurological deficits, no weakness, no visual changes, no rhinorrhea. CT revealed minimally depressed right nasal bone fracture. Discussed supportive measures with patient to include ice and NSAIDs. Discussed strict return precautions. Instructed patient to follow-up with her primary care provider. Patient expressed understanding to the discharge structures.    Jerre Simon, PA 02/29/16 1418  Benjiman Core, MD 02/29/16 1626

## 2017-10-01 IMAGING — CT CT HEAD W/O CM
3 of 5 series · 15 of 47 positions shown, 18 images · non-contrast
Comparison: 01/27/2009 face CT

CLINICAL DATA: Fall onto the nose with swelling. Injury yesterday
related to trip on stairs. Headache Initial encounter.

EXAM:
CT HEAD WITHOUT CONTRAST
CT MAXILLOFACIAL WITHOUT CONTRAST
TECHNIQUE: Multidetector CT imaging of the head and maxillofacial structures
were performed using the standard protocol without intravenous
contrast. Multiplanar CT image reconstructions of the maxillofacial
structures were also generated.

[Series 5: facial st · axial · 0.41mm/px · z∈[-244,-88]mm · 9 of 92 slices shown, 12 images]
[im 7/92  brain]
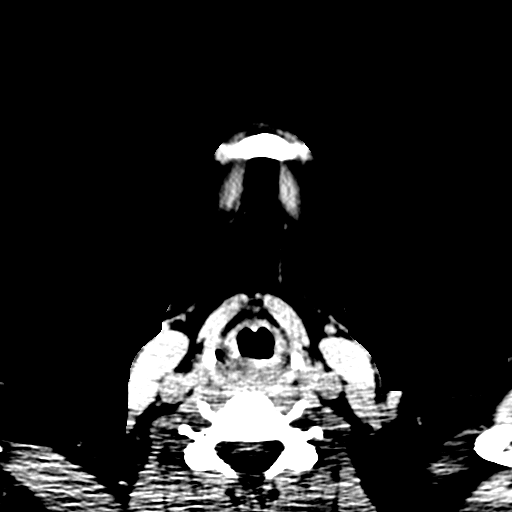
[im 7/92  bone]
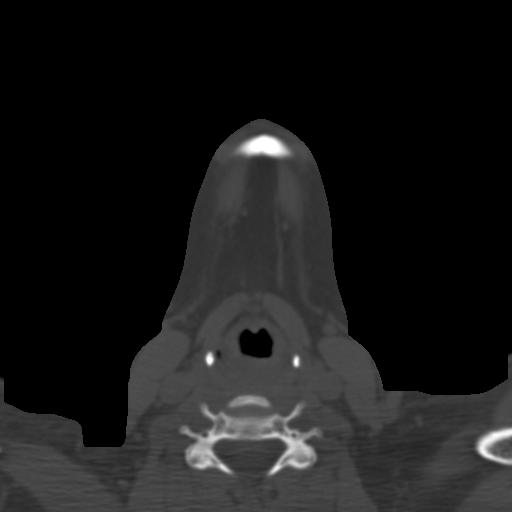
[im 19/92  brain]
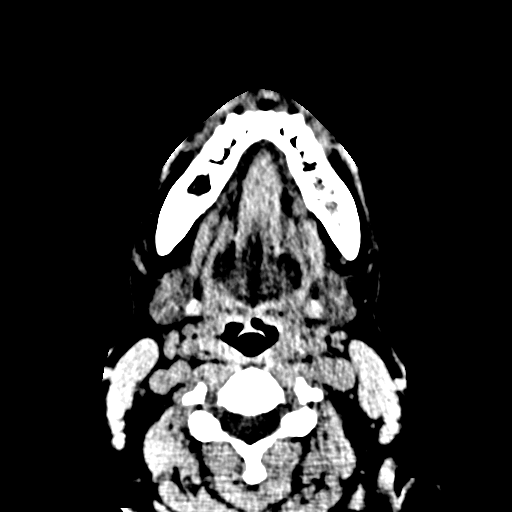
[im 25/92  brain]
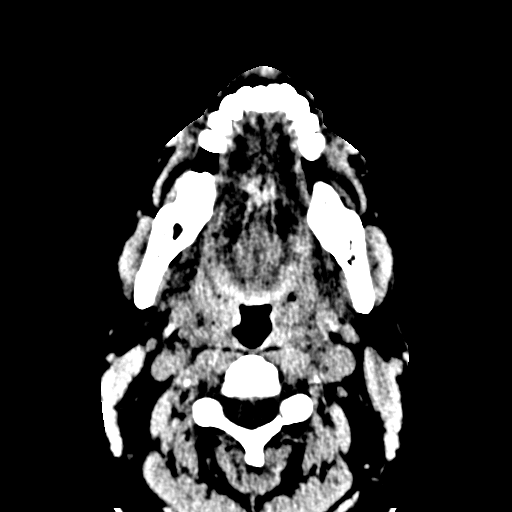
[im 37/92  brain]
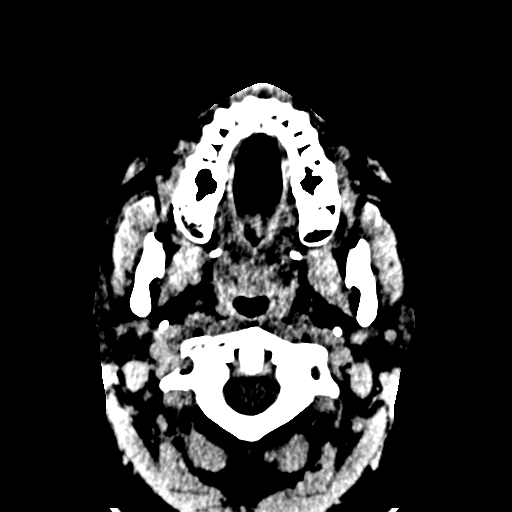
[im 49/92  brain]
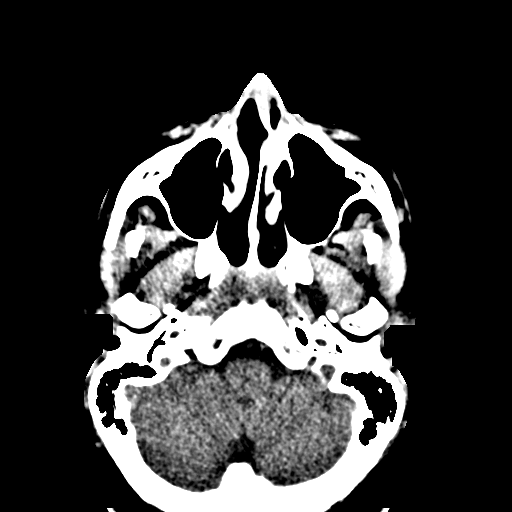
[im 49/92  bone]
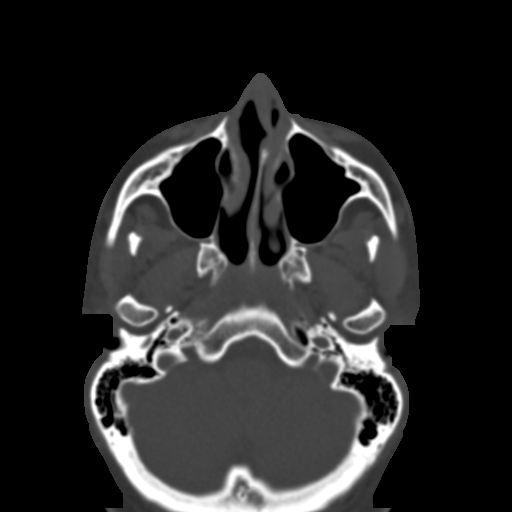
[im 55/92  brain]
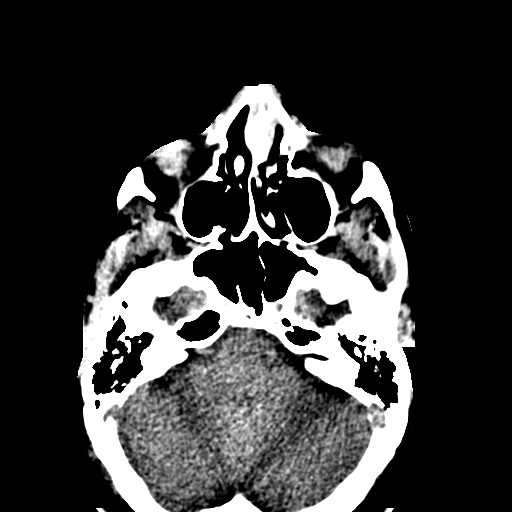
[im 67/92  brain]
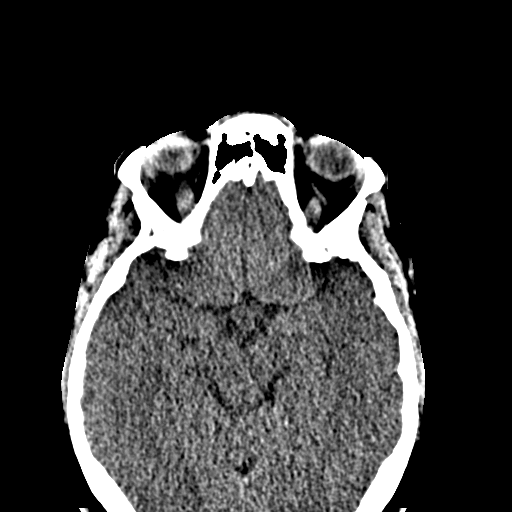
[im 73/92  brain]
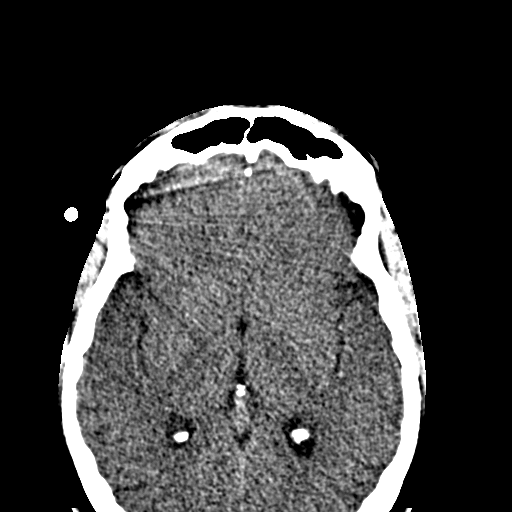
[im 85/92  brain]
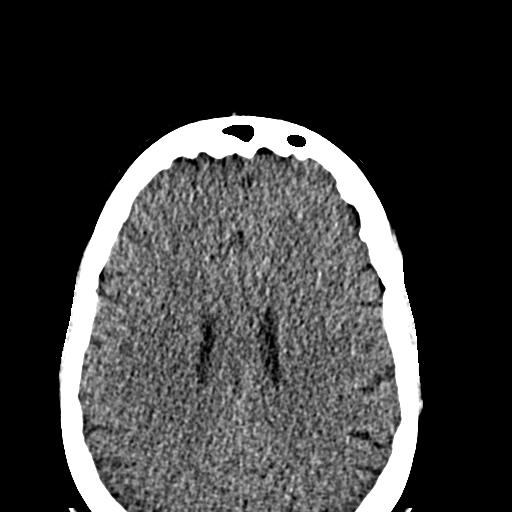
[im 85/92  bone]
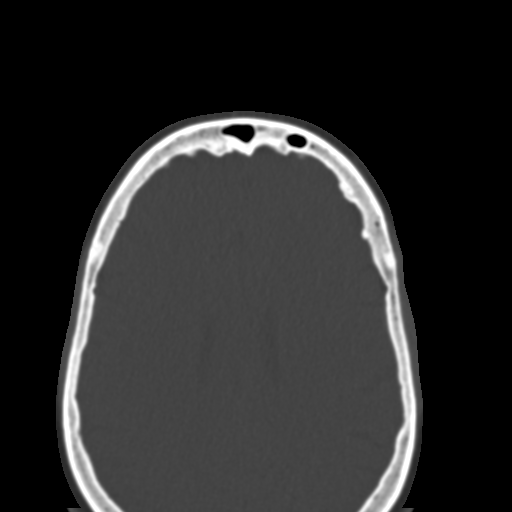

[Series 9: coronal st · coronal · 0.36mm/px · 3 of 70 slices shown]
[im 24/70  brain]
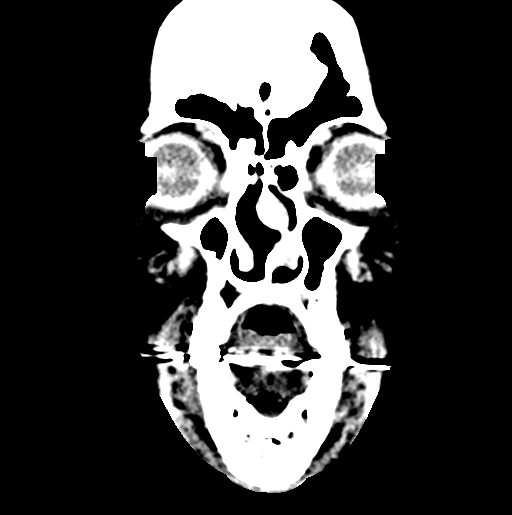
[im 31/70  brain]
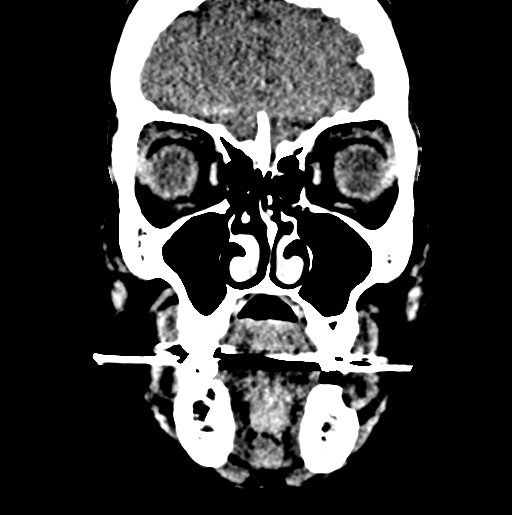
[im 39/70  brain]
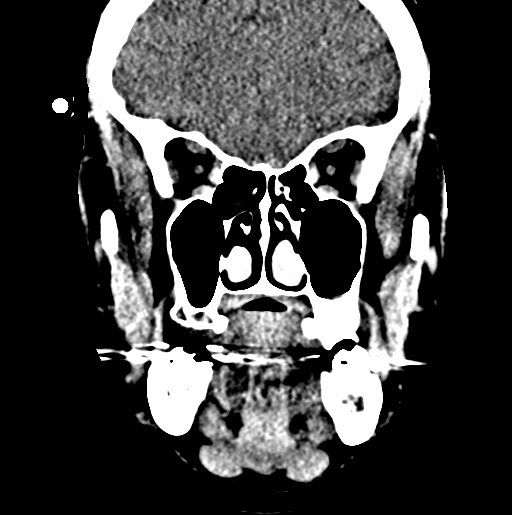

[Series 10: sagittal st · sagittal · 0.36mm/px · 3 of 87 slices shown]
[im 29/87  brain]
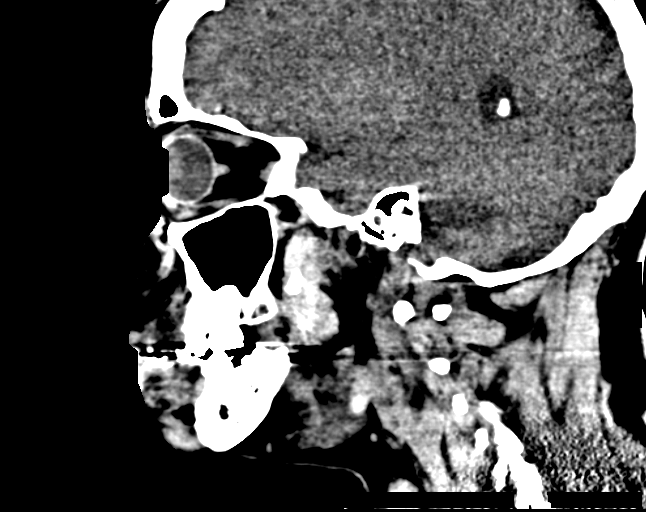
[im 44/87  brain]
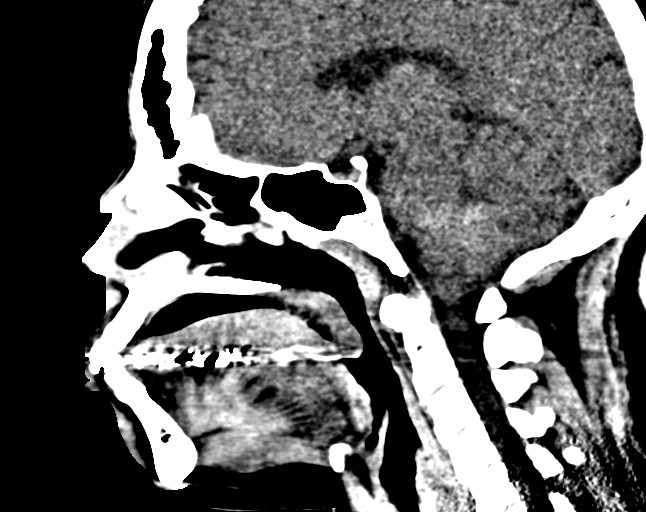
[im 58/87  brain]
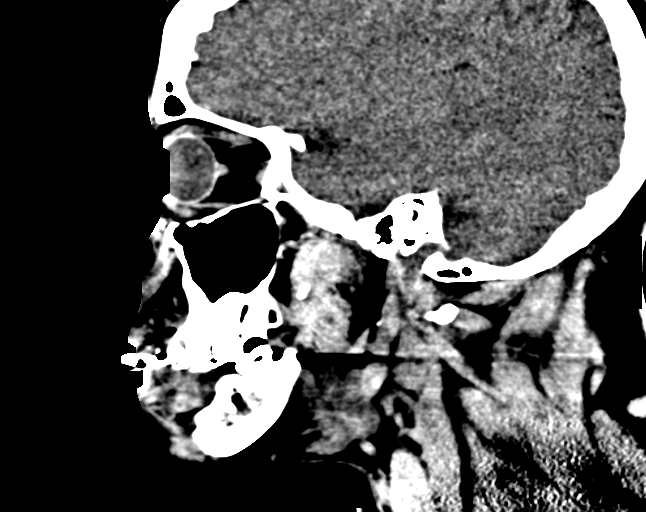

[15 of 47 positions shown; findings below may reference images not displayed]

FINDINGS: CT HEAD FINDINGS

Skull:Negative for calvarial fracture. Facial findings described
below.

Brain: Negative. No evidence of acute infarction, hemorrhage,
hydrocephalus, or mass lesion/mass effect.

CT MAXILLOFACIAL FINDINGS

There is a minimally depressed right nasal bone fracture that is
newly seen. Leftward septal deviation with left anterior nasal
cavity stenosis is chronic and without visible septal fracture. The
mandible is intact and located. No hemo sinus. No evidence of globe
injury or postseptal hematoma.

Subcutaneous nodule in the right cheek, usually dermal inclusion
cyst.

Carious maxillary and mandibular molars. The lower right terminal
molar has a notable periapical erosion.
IMPRESSION: 1. Minimally depressed right nasal bone fracture.
2. No evidence of intracranial injury.
3. Chronic septal deviation to the left with nasal cavity stenosis.

## 2022-10-29 ENCOUNTER — Emergency Department (HOSPITAL_COMMUNITY): Payer: Medicaid Other

## 2022-10-29 ENCOUNTER — Inpatient Hospital Stay (HOSPITAL_COMMUNITY)
Admission: EM | Admit: 2022-10-29 | Discharge: 2022-11-01 | DRG: 439 | Disposition: A | Discharge status: Home / Self Care | Payer: Medicaid Other | Attending: Internal Medicine | Admitting: Internal Medicine

## 2022-10-29 ENCOUNTER — Other Ambulatory Visit: Payer: Self-pay

## 2022-10-29 ENCOUNTER — Encounter (HOSPITAL_COMMUNITY): Payer: Self-pay

## 2022-10-29 DIAGNOSIS — E871 Hypo-osmolality and hyponatremia: Secondary | ICD-10-CM | POA: Diagnosis present

## 2022-10-29 DIAGNOSIS — Z789 Other specified health status: Secondary | ICD-10-CM | POA: Diagnosis present

## 2022-10-29 DIAGNOSIS — K852 Alcohol induced acute pancreatitis without necrosis or infection: Principal | ICD-10-CM | POA: Diagnosis present

## 2022-10-29 DIAGNOSIS — R17 Unspecified jaundice: Secondary | ICD-10-CM | POA: Diagnosis present

## 2022-10-29 DIAGNOSIS — R748 Abnormal levels of other serum enzymes: Secondary | ICD-10-CM | POA: Diagnosis not present

## 2022-10-29 DIAGNOSIS — E876 Hypokalemia: Secondary | ICD-10-CM | POA: Diagnosis present

## 2022-10-29 DIAGNOSIS — K859 Acute pancreatitis without necrosis or infection, unspecified: Secondary | ICD-10-CM | POA: Diagnosis present

## 2022-10-29 DIAGNOSIS — F17210 Nicotine dependence, cigarettes, uncomplicated: Secondary | ICD-10-CM | POA: Diagnosis present

## 2022-10-29 DIAGNOSIS — K858 Other acute pancreatitis without necrosis or infection: Secondary | ICD-10-CM | POA: Diagnosis present

## 2022-10-29 LAB — COMPREHENSIVE METABOLIC PANEL
ALT: 56 U/L — ABNORMAL HIGH (ref 0–44)
AST: 199 U/L — ABNORMAL HIGH (ref 15–41)
Albumin: 4.1 g/dL (ref 3.5–5.0)
Alkaline Phosphatase: 115 U/L (ref 38–126)
Anion gap: 24 — ABNORMAL HIGH (ref 5–15)
BUN: 5 mg/dL — ABNORMAL LOW (ref 6–20)
CO2: 17 mmol/L — ABNORMAL LOW (ref 22–32)
Calcium: 9.6 mg/dL (ref 8.9–10.3)
Chloride: 95 mmol/L — ABNORMAL LOW (ref 98–111)
Creatinine, Ser: 0.78 mg/dL (ref 0.44–1.00)
GFR, Estimated: >60 mL/min (ref >60)
Glucose, Bld: 119 mg/dL — ABNORMAL HIGH (ref 70–99)
Potassium: 2.4 mmol/L — CL (ref 3.5–5.1)
Sodium: 136 mmol/L (ref 135–145)
Total Bilirubin: 1.4 mg/dL — ABNORMAL HIGH (ref 0.3–1.2)
Total Protein: 8.3 g/dL — ABNORMAL HIGH (ref 6.5–8.1)

## 2022-10-29 LAB — CBC WITH DIFFERENTIAL/PLATELET
Abs Immature Granulocytes: 0.04 10*3/uL (ref 0.00–0.07)
Basophils Absolute: 0 10*3/uL (ref 0.0–0.1)
Basophils Relative: 0 %
Eosinophils Absolute: 0 10*3/uL (ref 0.0–0.5)
Eosinophils Relative: 0 %
HCT: 37.4 % (ref 36.0–46.0)
Hemoglobin: 12.7 g/dL (ref 12.0–15.0)
Immature Granulocytes: 1 %
Lymphocytes Relative: 11 %
Lymphs Abs: 1 10*3/uL (ref 0.7–4.0)
MCH: 29.4 pg (ref 26.0–34.0)
MCHC: 34 g/dL (ref 30.0–36.0)
MCV: 86.6 fL (ref 80.0–100.0)
Monocytes Absolute: 0.5 10*3/uL (ref 0.1–1.0)
Monocytes Relative: 6 %
Neutro Abs: 7.2 10*3/uL (ref 1.7–7.7)
Neutrophils Relative %: 82 %
Platelets: 208 10*3/uL (ref 150–400)
RBC: 4.32 MIL/uL (ref 3.87–5.11)
RDW: 16.8 % — ABNORMAL HIGH (ref 11.5–15.5)
WBC: 8.8 10*3/uL (ref 4.0–10.5)
nRBC: 0 % (ref 0.0–0.2)

## 2022-10-29 LAB — URINALYSIS, ROUTINE W REFLEX MICROSCOPIC
Glucose, UA: NEGATIVE mg/dL
Ketones, ur: 80 mg/dL — AB
Nitrite: NEGATIVE
Protein, ur: 100 mg/dL — AB
Specific Gravity, Urine: 1.025 (ref 1.005–1.030)
pH: 6 (ref 5.0–8.0)

## 2022-10-29 LAB — MAGNESIUM: Magnesium: 1.3 mg/dL — ABNORMAL LOW (ref 1.7–2.4)

## 2022-10-29 LAB — I-STAT BETA HCG BLOOD, ED (MC, WL, AP ONLY): I-stat hCG, quantitative: <5 m[IU]/mL (ref <5)

## 2022-10-29 LAB — LIPASE, BLOOD: Lipase: 1555 U/L — ABNORMAL HIGH (ref 11–51)

## 2022-10-29 MED ORDER — HEPARIN SODIUM (PORCINE) 5000 UNIT/ML IJ SOLN
5000.0000 [IU] | Freq: Three times a day (TID) | INTRAMUSCULAR | Status: DC
  Administered 2022-10-29 – 2022-10-31 (×5): 5000 [IU] via SUBCUTANEOUS
  Filled 2022-10-29 (×6): qty 1

## 2022-10-29 MED ORDER — IOHEXOL 350 MG/ML SOLN
75.0000 mL | Freq: Once | INTRAVENOUS | Status: AC | PRN
  Administered 2022-10-29: 75 mL via INTRAVENOUS

## 2022-10-29 MED ORDER — KETOROLAC TROMETHAMINE 15 MG/ML IJ SOLN
15.0000 mg | Freq: Once | INTRAMUSCULAR | Status: AC
  Administered 2022-10-29: 15 mg via INTRAVENOUS
  Filled 2022-10-29: qty 1

## 2022-10-29 MED ORDER — HYDROMORPHONE HCL 1 MG/ML IJ SOLN
0.5000 mg | Freq: Once | INTRAMUSCULAR | Status: AC
  Administered 2022-10-29: 0.5 mg via INTRAVENOUS
  Filled 2022-10-29: qty 1

## 2022-10-29 MED ORDER — ONDANSETRON HCL 4 MG PO TABS: 4.0000 mg | ORAL_TABLET | Freq: Four times a day (QID) | ORAL | Status: DC | PRN

## 2022-10-29 MED ORDER — SODIUM CHLORIDE 0.9 % IV SOLN
12.5000 mg | Freq: Once | INTRAVENOUS | Status: AC
  Administered 2022-10-29: 12.5 mg via INTRAVENOUS
  Filled 2022-10-29: qty 0.5

## 2022-10-29 MED ORDER — ONDANSETRON HCL 4 MG/2ML IJ SOLN
4.0000 mg | Freq: Four times a day (QID) | INTRAMUSCULAR | Status: DC | PRN
  Administered 2022-10-30: 4 mg via INTRAVENOUS
  Filled 2022-10-29: qty 2

## 2022-10-29 MED ORDER — POTASSIUM CHLORIDE CRYS ER 20 MEQ PO TBCR
40.0000 meq | EXTENDED_RELEASE_TABLET | Freq: Once | ORAL | Status: AC
  Administered 2022-10-29: 40 meq via ORAL
  Filled 2022-10-29: qty 2

## 2022-10-29 MED ORDER — HYDROMORPHONE HCL 1 MG/ML IJ SOLN
0.5000 mg | INTRAMUSCULAR | Status: DC | PRN
  Administered 2022-10-30: 1 mg via INTRAVENOUS
  Filled 2022-10-29: qty 1

## 2022-10-29 MED ORDER — POTASSIUM CHLORIDE 10 MEQ/100ML IV SOLN
10.0000 meq | INTRAVENOUS | Status: AC
  Administered 2022-10-29 (×4): 10 meq via INTRAVENOUS
  Filled 2022-10-29 (×4): qty 100

## 2022-10-29 MED ORDER — ONDANSETRON HCL 4 MG/2ML IJ SOLN: 4.0000 mg | Freq: Once | INTRAMUSCULAR | Status: DC

## 2022-10-29 MED ORDER — MAGNESIUM SULFATE 2 GM/50ML IV SOLN
2.0000 g | Freq: Once | INTRAVENOUS | Status: AC
  Administered 2022-10-29: 2 g via INTRAVENOUS
  Filled 2022-10-29: qty 50

## 2022-10-29 MED ORDER — LACTATED RINGERS IV SOLN: INTRAVENOUS | Status: AC

## 2022-10-29 MED ORDER — ONDANSETRON 4 MG PO TBDP
4.0000 mg | ORAL_TABLET | Freq: Once | ORAL | Status: AC
  Administered 2022-10-29: 4 mg via ORAL
  Filled 2022-10-29: qty 1

## 2022-10-29 MED ORDER — SODIUM CHLORIDE 0.9 % IV BOLUS
1000.0000 mL | Freq: Once | INTRAVENOUS | Status: AC
  Administered 2022-10-29: 1000 mL via INTRAVENOUS

## 2022-10-29 NOTE — ED Notes (Addendum)
ED TO INPATIENT HANDOFF REPORT  ED Nurse Name and Phone #: Jesse Sans [phone]  S Name/Age/Gender Sydney Dalton 47 y.o. female Room/Bed: 022C/022C  Code Status   Code Status: Not on file  Home/SNF/Other Home Patient oriented to: self, place, time, and situation Is this baseline? Yes   Triage Complete: Triage complete  Chief Complaint Acute pancreatitis without infection or necrosis [K85.90]  Triage Note BIB EMS from work. Hyperventilating when EMS arrived. Left flank pain started yesterday with N/V. Vomited 4 times today.   Vitals:  152/95 70 Pulse 100% RA 20 RR 132 CBG    Allergies No Known Allergies  Level of Care/Admitting Diagnosis ED Disposition     ED Disposition  Admit   Condition  --   Comment  Hospital Area: [facility] [[id number]]  Level of Care: Med-Surg [16]  May admit patient to [facility] or [facility] if equivalent level of care is available:: No  Covid Evaluation: Asymptomatic - no recent exposure (last 10 days) testing not required  Diagnosis: Acute pancreatitis without infection or necrosis [[id number]]  Admitting Physician: Charlsie Quest [[id number]]  Attending Physician: Charlsie Quest [[id number]]  Certification:: I certify this patient will need inpatient services for at least 2 midnights  Estimated Length of Stay: 2          B Medical/Surgery History History reviewed. No pertinent past medical history. History reviewed. No pertinent surgical history.   A IV Location/Drains/Wounds Patient Lines/Drains/Airways Status     Active Line/Drains/Airways     Name Placement date Placement time Site Days   Peripheral IV 10/29/22 20 G Right Antecubital 10/29/22  1724  Antecubital  less than 1            Intake/Output Last 24 hours No intake or output data in the 24 hours ending 10/29/22 2141  Labs/Imaging Results for orders placed or performed during the hospital encounter of 10/29/22 (from the past 48 hour(s))   Urinalysis, Routine w reflex microscopic     Status: Abnormal   Collection Time: 10/29/22  3:32 PM  Result Value Ref Range   Color, Urine AMBER (A) YELLOW    Comment: BIOCHEMICALS MAY BE AFFECTED BY COLOR   APPearance HAZY (A) CLEAR   Specific Gravity, Urine 1.025 1.005 - 1.030   pH 6.0 5.0 - 8.0   Glucose, UA NEGATIVE NEGATIVE mg/dL   Hgb urine dipstick MODERATE (A) NEGATIVE   Bilirubin Urine SMALL (A) NEGATIVE   Ketones, ur 80 (A) NEGATIVE mg/dL   Protein, ur 283 (A) NEGATIVE mg/dL   Nitrite NEGATIVE NEGATIVE   Leukocytes,Ua MODERATE (A) NEGATIVE   RBC / HPF 6-10 0 - 5 RBC/hpf   WBC, UA 11-20 0 - 5 WBC/hpf   Bacteria, UA FEW (A) NONE SEEN   Squamous Epithelial / HPF 11-20 0 - 5 /HPF   Mucus PRESENT     Comment: Performed at [facility], [address], [place], [place] [address]  CBC with Differential     Status: Abnormal   Collection Time: 10/29/22  3:34 PM  Result Value Ref Range   WBC 8.8 4.0 - 10.5 K/uL   RBC 4.32 3.87 - 5.11 MIL/uL   Hemoglobin 12.7 12.0 - 15.0 g/dL   HCT 16.0 73.7 - 10.6 %   MCV 86.6 80.0 - 100.0 fL   MCH 29.4 26.0 - 34.0 pg   MCHC 34.0 30.0 - 36.0 g/dL   RDW 26.9 (H) 48.5 - 46.2 %  Platelets 208 150 - 400 K/uL   nRBC 0.0 0.0 - 0.2 %   Neutrophils Relative % 82 %   Neutro Abs 7.2 1.7 - 7.7 K/uL   Lymphocytes Relative 11 %   Lymphs Abs 1.0 0.7 - 4.0 K/uL   Monocytes Relative 6 %   Monocytes Absolute 0.5 0.1 - 1.0 K/uL   Eosinophils Relative 0 %   Eosinophils Absolute 0.0 0.0 - 0.5 K/uL   Basophils Relative 0 %   Basophils Absolute 0.0 0.0 - 0.1 K/uL   Immature Granulocytes 1 %   Abs Immature Granulocytes 0.04 0.00 - 0.07 K/uL    Comment: Performed at [facility], [address] [place], [place] [address]  Comprehensive metabolic panel     Status: Abnormal   Collection Time: 10/29/22  3:34 PM  Result Value Ref Range   Sodium 136 135 - 145 mmol/L   Potassium 2.4 (LL) 3.5 - 5.1 mmol/L    Comment: CRITICAL RESULT CALLED TO,  READ BACK BY AND VERIFIED WITH E ANELLO,RN 1800 01/232/2024 WBOND   Chloride 95 (L) 98 - 111 mmol/L   CO2 17 (L) 22 - 32 mmol/L   Glucose, Bld 119 (H) 70 - 99 mg/dL    Comment: Glucose reference range applies only to samples taken after fasting for at least 8 hours.   BUN 5 (L) 6 - 20 mg/dL   Creatinine, Ser 4.70 0.44 - 1.00 mg/dL   Calcium 9.6 8.9 - 96.2 mg/dL   Total Protein 8.3 (H) 6.5 - 8.1 g/dL   Albumin 4.1 3.5 - 5.0 g/dL   AST 836 (H) 15 - 41 U/L   ALT 56 (H) 0 - 44 U/L   Alkaline Phosphatase 115 38 - 126 U/L   Total Bilirubin 1.4 (H) 0.3 - 1.2 mg/dL   GFR, Estimated >62 >94 mL/min    Comment: (NOTE) Calculated using the CKD-EPI Creatinine Equation (2021)    Anion gap 24 (H) 5 - 15    Comment: ELECTROLYTES REPEATED TO VERIFY Performed at [facility], [address], [place], [place] [address]   Lipase, blood     Status: Abnormal   Collection Time: 10/29/22  3:34 PM  Result Value Ref Range   Lipase 1,555 (H) 11 - 51 U/L    Comment: RESULTS CONFIRMED BY MANUAL DILUTION Performed at [facility], [address], [place], [place] [address]   Magnesium     Status: Abnormal   Collection Time: 10/29/22  3:47 PM  Result Value Ref Range   Magnesium 1.3 (L) 1.7 - 2.4 mg/dL    Comment: Performed at [facility], [address], [place], [place] [address]  I-Stat beta hCG blood, ED     Status: None   Collection Time: 10/29/22  4:42 PM  Result Value Ref Range   I-stat hCG, quantitative <5.0 <5 mIU/mL   Comment 3            Comment:   GEST. AGE      CONC.  (mIU/mL)   <=1 WEEK        5 - 50     2 WEEKS       50 - 500     3 WEEKS       100 - 10,000     4 WEEKS     1,000 - 30,000        FEMALE AND NON-PREGNANT FEMALE:     LESS THAN 5 mIU/mL  CT ABDOMEN PELVIS W WO CONTRAST  Result Date: 10/29/2022 CLINICAL DATA:  Left lower quadrant abdominal pain EXAM: CT ABDOMEN AND PELVIS WITHOUT AND WITH CONTRAST TECHNIQUE: Multidetector CT imaging of the abdomen and  pelvis was performed following the standard protocol before and following the bolus administration of intravenous contrast. RADIATION DOSE REDUCTION: This exam was performed according to the departmental dose-optimization program which includes automated exposure control, adjustment of the mA and/or kV according to patient size and/or use of iterative reconstruction technique. CONTRAST:  44mL OMNIPAQUE IOHEXOL 350 MG/ML SOLN COMPARISON:  None Available. FINDINGS: Lower chest: No acute abnormality. Hepatobiliary: No focal liver abnormality is seen. No gallstones, gallbladder wall thickening, or biliary dilatation. Pancreas: Edematous pancreas with surrounding fluid and inflammatory change. No organized peripancreatic fluid collections. Calcification of the pancreatic head measuring 3 mm seen on series 11, image 28. No main pancreatic duct dilation. Spleen: Normal in size without focal abnormality. Adrenals/Urinary Tract: Bilateral adrenal glands are unremarkable. No hydronephrosis or nephrolithiasis. Bladder is unremarkable. Stomach/Bowel: Stomach is within normal limits. Appendix appears normal. No evidence of bowel wall thickening, distention, or inflammatory changes. Vascular/Lymphatic: Mild aortic atherosclerosis. No enlarged abdominal or pelvic lymph nodes. Reproductive: Uterus and bilateral adnexa are unremarkable. Other: No abdominal wall hernia or abnormality. No abdominopelvic ascites. Musculoskeletal: No acute or significant osseous findings. IMPRESSION: 1. Findings compatible with acute uncomplicated pancreatitis. 2. Calcification of the pancreatic head measuring 3 mm, possibly a distal common bile duct stone or pancreatic parenchymal calcification. MRCP could be performed for further evaluation. 3. Aortic Atherosclerosis (ICD10-I70.0). Electronically Signed   By: Yetta Glassman M.D.   On: 10/29/2022 19:48    Pending Labs Unresulted Labs (From admission, onward)    None        Vitals/Pain Today's Vitals   10/29/22 1915 10/29/22 1952 10/29/22 2030 10/29/22 2115  BP: (!) 154/82  (!) 156/87 (!) 147/78  Pulse: 68  84 88  Resp: 15  18 15   Temp: 97.6 F (36.4 C)     TempSrc: Oral     SpO2: 100%  100% 100%  Weight:      Height:      PainSc:  5       Isolation Precautions No active isolations  Medications Medications  potassium chloride 10 mEq in 100 mL IVPB (10 mEq Intravenous New Bag/Given 10/29/22 2138)  ondansetron (ZOFRAN-ODT) disintegrating tablet 4 mg (4 mg Oral Given 10/29/22 1533)  ketorolac (TORADOL) 15 MG/ML injection 15 mg (15 mg Intravenous Given 10/29/22 1820)  sodium chloride 0.9 % bolus 1,000 mL (0 mLs Intravenous Stopped 10/29/22 1942)  promethazine (PHENERGAN) 12.5 mg in sodium chloride 0.9 % 50 mL IVPB (0 mg Intravenous Stopped 10/29/22 1901)  potassium chloride SA (KLOR-CON M) CR tablet 40 mEq (40 mEq Oral Given 10/29/22 1839)  HYDROmorphone (DILAUDID) injection 0.5 mg (0.5 mg Intravenous Given 10/29/22 1916)  iohexol (OMNIPAQUE) 350 MG/ML injection 75 mL (75 mLs Intravenous Contrast Given 10/29/22 1906)  magnesium sulfate IVPB 2 g 50 mL (0 g Intravenous Stopped 10/29/22 2033)  HYDROmorphone (DILAUDID) injection 0.5 mg (0.5 mg Intravenous Given 10/29/22 2112)    Mobility walks     Focused Assessments Neuro Assessment Handoff:  Swallow screen pass? Yes          Neuro Assessment:   Neuro Checks:      Has TPA been given? No If patient is a Neuro Trauma and patient is going to OR before floor call report to [place] nurse: [phone] or [phone]  R Recommendations: See Admitting Provider Note  Report given to:   Additional Notes: pt is AAOx4. Pt is ambulatory to bathroom. Pt is on RA.

## 2022-10-29 NOTE — ED Provider Triage Note (Signed)
Emergency Medicine Provider Triage Evaluation Note  Sydney Dalton , a 47 y.o. female  was evaluated in triage.  Pt complains of left flank pain onset yesterday. Notes that she feels pain to her left sided abdomen as well. Has associated nausea, vomiting.  Denies history of UTI or kidney stones.   Review of Systems  Positive:  Negative:   Physical Exam  BP 132/89   Pulse 91   Temp (!) 97.3 F (36.3 C)   Resp (!) 22   Ht 5\' 5"  (1.651 m)   Wt 72.6 kg   SpO2 100%   BMI 26.63 kg/m  Gen:   Awake, no distress   Resp:  Normal effort  MSK:   Moves extremities without difficulty  Other:  Bilateral CVA TTP, R>L. Left sided abdominal TTP.   Medical Decision Making  Medically screening exam initiated at 3:24 PM.  Appropriate orders placed.  Sydney Dalton was informed that the remainder of the evaluation will be completed by another provider, this initial triage assessment does not replace that evaluation, and the importance of remaining in the ED until their evaluation is complete.  Work-up initiated.    Drina Jobst A, PA-C 10/29/22 1527

## 2022-10-29 NOTE — ED Provider Notes (Cosign Needed Addendum)
This patient's care was assumed by me at shift change. The tests pending are CT. Plan is for admit after results.   Patient was re-evaluated by me as well. I discussed their result with them and plan is for admission.  She was ordered another dose of Dilaudid to help with pain control.    CT results were discussed with the patient.  They do show ossification in the pancreatic head that could be a distal common bile duct stone.   Consulted unassigned GI who recommended ordering stat MRCP and admitting the patient to the hospitalist. I discussed the case with the hospitalist, Dr. Posey Pronto who will admit the pateint.      Gwenevere Abbot, PA-C 10/29/22 2130    Gwenevere Abbot, PA-C 10/29/22 2130    Wynona Dove A, DO 10/31/22 [phone]

## 2022-10-29 NOTE — ED Provider Notes (Signed)
[facility] Provider Note   CSN: [id number] Arrival date & time: 10/29/22  1500     History  Chief Complaint  Patient presents with   Abdominal Pain    Sydney Dalton is a 47 y.o. female, no pertinent past medical history, presents to the ED secondary to left upper quadrant pain, that wraps around to the back for the last day.  She states that yesterday it was intermittent, now it is constant, but pain waxes and wanes in nature.  She states that at times it is an 8 out of 10 other times is a 20 out of 10.  She describes pain as sharp and stabbing, and associated with severe nausea and vomiting.  She states that she cannot stop vomiting.  Has not been able to keep anything down.     Home Medications Prior to Admission medications   Medication Sig Start Date End Date Taking? Authorizing Provider  Probiotic Product (PROBIOTIC PO) Take 1 capsule by mouth daily.    [provider]      Allergies    Patient has no known allergies.    Review of Systems   Review of Systems  Gastrointestinal:  Positive for abdominal pain, nausea and vomiting. Negative for diarrhea.    Physical Exam Updated Vital Signs BP (!) 151/93   Pulse 64   Temp (!) 97.3 F (36.3 C)   Resp 20   Ht 5\' 5"  (1.651 m)   Wt 72.6 kg   SpO2 100%   BMI 26.63 kg/m  Physical Exam Vitals and nursing note reviewed.  Constitutional:      General: She is not in acute distress.    Appearance: She is well-developed.  HENT:     Head: Normocephalic and atraumatic.  Eyes:     Conjunctiva/sclera: Conjunctivae normal.  Cardiovascular:     Rate and Rhythm: Normal rate and regular rhythm.     Heart sounds: No murmur heard. Pulmonary:     Effort: Pulmonary effort is normal. No respiratory distress.     Breath sounds: Normal breath sounds.  Abdominal:     Palpations: Abdomen is soft.     Tenderness: There is abdominal tenderness in the epigastric area and left  upper quadrant. There is guarding.  Musculoskeletal:        General: No swelling.     Cervical back: Neck supple.  Skin:    General: Skin is warm and dry.     Capillary Refill: Capillary refill takes less than 2 seconds.  Neurological:     Mental Status: She is alert.  Psychiatric:        Mood and Affect: Mood normal.     ED Results / Procedures / Treatments   Labs (all labs ordered are listed, but only abnormal results are displayed) Labs Reviewed  CBC WITH DIFFERENTIAL/PLATELET - Abnormal; Notable for the following components:      Result Value   RDW 16.8 (*)    All other components within normal limits  COMPREHENSIVE METABOLIC PANEL - Abnormal; Notable for the following components:   Potassium 2.4 (*)    Chloride 95 (*)    CO2 17 (*)    Glucose, Bld 119 (*)    BUN 5 (*)    Total Protein 8.3 (*)    AST 199 (*)    ALT 56 (*)    Total Bilirubin 1.4 (*)    Anion gap 24 (*)    All other components  within normal limits  LIPASE, BLOOD - Abnormal; Notable for the following components:   Lipase 1,555 (*)    All other components within normal limits  URINALYSIS, ROUTINE W REFLEX MICROSCOPIC - Abnormal; Notable for the following components:   Color, Urine AMBER (*)    APPearance HAZY (*)    Hgb urine dipstick MODERATE (*)    Bilirubin Urine Sydney Dalton (*)    Ketones, ur 80 (*)    Protein, ur 100 (*)    Leukocytes,Ua MODERATE (*)    Bacteria, UA FEW (*)    All other components within normal limits  MAGNESIUM  I-STAT BETA HCG BLOOD, ED ([facility], [facility], [facility] ONLY)    EKG None  Radiology No results found.  Procedures Procedures    Medications Ordered in ED Medications  promethazine (PHENERGAN) 12.5 mg in sodium chloride 0.9 % 50 mL IVPB (12.5 mg Intravenous New Bag/Given 10/29/22 1840)  potassium chloride 10 mEq in 100 mL IVPB (10 mEq Intravenous New Bag/Given 10/29/22 1842)  ondansetron (ZOFRAN-ODT) disintegrating tablet 4 mg (4 mg Oral Given 10/29/22 1533)  ketorolac (TORADOL)  15 MG/ML injection 15 mg (15 mg Intravenous Given 10/29/22 1820)  sodium chloride 0.9 % bolus 1,000 mL (1,000 mLs Intravenous New Bag/Given 10/29/22 1730)  potassium chloride SA (KLOR-CON M) CR tablet 40 mEq (40 mEq Oral Given 10/29/22 1839)    ED Course/ Medical Decision Making/ A&P                           Medical Decision Making Patient is a 47 year old female, here for left upper quadrant pain radiating to the left of back that is been going on for about the last day, she states the pain is severe in nature, and worsening, denies any urinary symptoms.  I will obtain a CT renal study due to concerns for possible stone.  Will also obtain CBC, CMP and lipase for further evaluation.  As patient was going to CT I called CT requesting them to stop the renal study, and instead replace with CT abdomen pelvis, and it appears that they actually did both.  Amount and/or Complexity of Data Reviewed Labs: ordered.    Details: Lipase elevated at 1555, potassium 2.4 urinalysis contaminated, with a blood possibly secondary to dehydration. Radiology: ordered. Discussion of management or test interpretation with external provider(s): CT pending, lipase elevated at 1555, and hypokalemic 2.4 we will check a magnesium, and replace with IV 40 mEq potassium, and give p.o. 40 mEq potassium.  Patient will likely require admission secondary to need for further IV potassium, hydration, and control of symptoms. Pancreatitis is likely alcohol induced 2/2 to patient drinking 2 shots/alcohol per day.   Signed out to [name] pending labs and admission to hospitalist.  Risk Prescription drug management.     Final Clinical Impression(s) / ED Diagnoses Final diagnoses:  Other acute pancreatitis, unspecified complication status    Rx / DC Orders ED Discharge Orders     None         Mikailah Morel, Si Gaul, PA 10/29/22 1905    Sherwood Gambler, MD 10/29/22 2023

## 2022-10-29 NOTE — Hospital Course (Signed)
Sydney Dalton is a 47 y.o. female with medical history significant for alcohol use who is admitted with acute pancreatitis.

## 2022-10-29 NOTE — ED Triage Notes (Signed)
BIB EMS from work. Hyperventilating when EMS arrived. Left flank pain started yesterday with N/V. Vomited 4 times today.   Vitals:  152/95 70 Pulse 100% RA 20 RR 132 CBG

## 2022-10-29 NOTE — H&P (Signed)
History and Physical    [name]:[id number] DOB: 04/11/1976 DOA: 10/29/2022  PCP: Patient, No Pcp Per  Patient coming from: Home  I have personally briefly reviewed patient's old medical records in [place]  Chief Complaint: Abdominal pain  HPI: Sydney Dalton is a 47 y.o. female with medical history significant for alcohol use who presented to the ED for evaluation of nausea, vomiting, abdominal pain.  Patient states she has not been feeling well for couple days now.  Last night she developed sharp epigastric abdominal pain which would radiate to her left flank and mid back.  Symptoms persisted this morning and she developed nausea with vomiting.  She went into work but continued to feel unwell.  She says she was try to stand up when she had an apparent near syncopal episode.  EMS were called and she was noted to be hyperventilating.  She was brought to the ED for further evaluation.  She has not been able to maintain any adequate oral intake.  She does not take any medications regularly.  She denies any chronic medical conditions.  She stressed that he smokes Black and milds, about 5/week.  She does report alcohol use about 2-3 shots of liquor on her days off but admits to increased alcohol use recently due to social stressors.  ED Course  Labs/Imaging on admission: I have personally reviewed following labs and imaging studies.  Initial vitals showed BP 132/89, pulse 91, RR 22, temp 97.3 F, SpO2 100% on room air.  Labs showed lipase 1555, sodium 136, potassium 2.4, magnesium 1.3, bicarb 17, BUN 5, creatinine 0.78, serum glucose 119, AST 199, ALT 56, alk phos 115, total bilirubin 1.4, WBC 8.8, hemoglobin 12.7, platelets 208,000.  I-STAT beta-hCG <5.0.  CT abdomen/pelvis with and without contrast shows acute uncomplicated pancreatitis with calcification of the pancreatic head measuring 3 mm, possibly a distal CBD stone or pancreatic parenchymal  calcification.  Patient was given 1 L normal saline, oral K 40 mEq, IV K 10 mEq x 4, IV magnesium 2 g, Dilaudid, Toradol, Zofran, Phenergan.  EDP discussed with on-call [facility] who recommended MRCP, medical admission, and they will follow in consultation.  The hospitalist service was consulted to admit for further evaluation and management.  Review of Systems: All systems reviewed and are negative except as documented in history of present illness above.   History reviewed. No pertinent past medical history.  History reviewed. No pertinent surgical history.  Social History:  reports that she has been smoking cigarettes. She does not have any smokeless tobacco history on file. She reports current alcohol use. She reports that she does not use drugs.  No Known Allergies  History reviewed. No pertinent family history.   Prior to Admission medications   Medication Sig Start Date End Date Taking? Authorizing Provider  Probiotic Product (PROBIOTIC PO) Take 1 capsule by mouth daily.    [provider]    Physical Exam: Vitals:   10/29/22 1915 10/29/22 2030 10/29/22 2115 10/29/22 2145  BP: (!) 154/82 (!) 156/87 (!) 147/78 (!) 141/86  Pulse: 68 84 88 87  Resp: 15 18 15 15   Temp: 97.6 F (36.4 C)   97.7 F (36.5 C)  TempSrc: Oral   Oral  SpO2: 100% 100% 100% 100%  Weight:      Height:       Constitutional: Resting in bed, NAD, calm, comfortable Eyes: EOMI, lids and conjunctivae normal ENMT: Mucous membranes are dry. Posterior pharynx clear  of any exudate or lesions.Normal dentition.  Neck: normal, supple, no masses. Respiratory: clear to auscultation bilaterally, no wheezing, no crackles. Normal respiratory effort. No accessory muscle use.  Cardiovascular: Regular rate and rhythm, no murmurs / rubs / gallops. No extremity edema. 2+ pedal pulses. Abdomen: Soft, epigastric tenderness, no masses palpated.  Musculoskeletal: no clubbing / cyanosis. No joint deformity upper  and lower extremities. Good ROM, no contractures. Normal muscle tone.  Skin: no rashes, lesions, ulcers. No induration Neurologic: Sensation intact. Strength 5/5 in all 4.  Psychiatric: Alert and oriented x 3. Normal mood.   EKG: Not performed.  Assessment/Plan Principal Problem:   Acute pancreatitis without infection or necrosis Active Problems:   Hypokalemia   Hypomagnesemia   Alcohol use   [name] is a 47 y.o. female with medical history significant for alcohol use who is admitted with acute pancreatitis.  Assessment and Plan: Acute pancreatitis: Lipase 1555, AST 199, LT 56, T. bili 1.4.  CT imaging consistent with acute uncomplicated pancreatitis and also shows calcification of the pancreatic head measuring 3 mm.  This may be a distal CBD stone or pancreatic parenchymal calcification.  Alcohol associated pancreatitis also considered.  [facility] to follow, recommended MRCP. -Keep n.p.o. -Continue IV fluid hydration overnight -IV analgesics and antiemetics as needed -Follow MRCP  Hypokalemia/hypomagnesemia: IV magnesium and oral and IV potassium supplementation given in the ED.  Repeat labs in AM.  Alcohol use: Admits to recent increased alcohol use due to social stressors.  No sign of withdrawal on admission.  Continue to monitor.   DVT prophylaxis: heparin injection 5,000 Units Start: 10/29/22 2200 Code Status: Full code Family Communication: Discussed with patient, she has discussed with family Disposition Plan: From home, dispo pending clinical progress Consults called: [facility] Severity of Illness: The appropriate patient status for this patient is INPATIENT. Inpatient status is judged to be reasonable and necessary in order to provide the required intensity of service to ensure the patient's safety. The patient's presenting symptoms, physical exam findings, and initial radiographic and laboratory data in the context of their chronic comorbidities is felt to  place them at high risk for further clinical deterioration. Furthermore, it is not anticipated that the patient will be medically stable for discharge from the hospital within 2 midnights of admission.   * I certify that at the point of admission it is my clinical judgment that the patient will require inpatient hospital care spanning beyond 2 midnights from the point of admission due to high intensity of service, high risk for further deterioration and high frequency of surveillance required.Zada Finders MD [facility]  If 7PM-7AM, please contact night-coverage [email or web address]  10/29/2022, 9:56 PM

## 2022-10-30 ENCOUNTER — Inpatient Hospital Stay (HOSPITAL_COMMUNITY): Payer: Medicaid Other

## 2022-10-30 DIAGNOSIS — E876 Hypokalemia: Secondary | ICD-10-CM

## 2022-10-30 DIAGNOSIS — K852 Alcohol induced acute pancreatitis without necrosis or infection: Secondary | ICD-10-CM

## 2022-10-30 DIAGNOSIS — R748 Abnormal levels of other serum enzymes: Secondary | ICD-10-CM

## 2022-10-30 DIAGNOSIS — Z789 Other specified health status: Secondary | ICD-10-CM | POA: Diagnosis not present

## 2022-10-30 LAB — COMPREHENSIVE METABOLIC PANEL
ALT: 40 U/L (ref 0–44)
AST: 95 U/L — ABNORMAL HIGH (ref 15–41)
Albumin: 3.3 g/dL — ABNORMAL LOW (ref 3.5–5.0)
Alkaline Phosphatase: 103 U/L (ref 38–126)
Anion gap: 10 (ref 5–15)
BUN: <5 mg/dL — ABNORMAL LOW (ref 6–20)
CO2: 27 mmol/L (ref 22–32)
Calcium: 8.8 mg/dL — ABNORMAL LOW (ref 8.9–10.3)
Chloride: 97 mmol/L — ABNORMAL LOW (ref 98–111)
Creatinine, Ser: 0.66 mg/dL (ref 0.44–1.00)
GFR, Estimated: >60 mL/min (ref >60)
Glucose, Bld: 77 mg/dL (ref 70–99)
Potassium: 3.4 mmol/L — ABNORMAL LOW (ref 3.5–5.1)
Sodium: 134 mmol/L — ABNORMAL LOW (ref 135–145)
Total Bilirubin: 1.1 mg/dL (ref 0.3–1.2)
Total Protein: 7 g/dL (ref 6.5–8.1)

## 2022-10-30 LAB — CBC
HCT: 32.9 % — ABNORMAL LOW (ref 36.0–46.0)
Hemoglobin: 11.5 g/dL — ABNORMAL LOW (ref 12.0–15.0)
MCH: 29.9 pg (ref 26.0–34.0)
MCHC: 35 g/dL (ref 30.0–36.0)
MCV: 85.7 fL (ref 80.0–100.0)
Platelets: 161 10*3/uL (ref 150–400)
RBC: 3.84 MIL/uL — ABNORMAL LOW (ref 3.87–5.11)
RDW: 17.1 % — ABNORMAL HIGH (ref 11.5–15.5)
WBC: 7.6 10*3/uL (ref 4.0–10.5)
nRBC: 0 % (ref 0.0–0.2)

## 2022-10-30 LAB — MAGNESIUM: Magnesium: 1.9 mg/dL (ref 1.7–2.4)

## 2022-10-30 LAB — LIPASE, BLOOD: Lipase: 948 U/L — ABNORMAL HIGH (ref 11–51)

## 2022-10-30 LAB — HIV ANTIBODY (ROUTINE TESTING W REFLEX): HIV Screen 4th Generation wRfx: NONREACTIVE

## 2022-10-30 MED ORDER — GADOBUTROL 1 MMOL/ML IV SOLN
7.0000 mL | Freq: Once | INTRAVENOUS | Status: AC | PRN
  Administered 2022-10-30: 7 mL via INTRAVENOUS

## 2022-10-30 MED ORDER — POTASSIUM CHLORIDE CRYS ER 20 MEQ PO TBCR
40.0000 meq | EXTENDED_RELEASE_TABLET | Freq: Once | ORAL | Status: AC
  Administered 2022-10-30: 40 meq via ORAL
  Filled 2022-10-30: qty 2

## 2022-10-30 MED ORDER — POTASSIUM CHLORIDE 2 MEQ/ML IV SOLN
INTRAVENOUS | Status: DC
  Filled 2022-10-30 (×4): qty 1000

## 2022-10-30 MED ORDER — PANTOPRAZOLE SODIUM 40 MG IV SOLR
40.0000 mg | INTRAVENOUS | Status: DC
  Administered 2022-10-30 – 2022-10-31 (×2): 40 mg via INTRAVENOUS
  Filled 2022-10-30 (×2): qty 10

## 2022-10-30 MED ORDER — OXYCODONE HCL 5 MG PO TABS
5.0000 mg | ORAL_TABLET | Freq: Four times a day (QID) | ORAL | Status: DC | PRN
  Administered 2022-10-30 (×2): 5 mg via ORAL
  Filled 2022-10-30 (×2): qty 1

## 2022-10-30 NOTE — Progress Notes (Signed)
PROGRESS NOTE  Sydney Dalton NGE:[id number] DOB: [birth year]   PCP: Patient, No Pcp Per  Patient is from: Home  DOA: 10/29/2022 LOS: 1  Chief complaints Chief Complaint  Patient presents with   Abdominal Pain     Brief Narrative / Interim history: 47 year old F with PMH of alcohol use presenting with nausea, vomiting, epigastric abdominal pain and near syncope and admitted for acute uncomplicated alcoholic pancreatitis.  Reports drinking 2-3 shots of liquor on her days of but admits to increased alcohol use recently due to social stressors.  Lipase elevated to 1555.  CT abdomen showed uncomplicated acute pancreatitis with calcification of the pancreatic head measuring about 3 mm.  GI consulted and recommended MRCP, that showed severe acute pancreatitis without complication but with probable pancreatic ductal calcification in the region of the pancreatic head.  No choledocholithiasis or biliary tract obstruction.  GI recommended outpatient follow-up and signed off.    Subjective: Seen and examined earlier this morning.  No major events overnight of this morning.  Reports some improvement in her pain but still rates her pain 9/10.  She states the pain was 25 out of 10 before.  She denies nausea or vomiting. Objective: Vitals:   10/29/22 2145 10/29/22 2302 10/30/22 0354 10/30/22 0811  BP: (!) 141/86 (!) 155/97 134/74 (!) 125/59  Pulse: 87 89 89 96  Resp: 15  20 17   Temp: 97.7 F (36.5 C) 98.1 F (36.7 C) 98.2 F (36.8 C) 98.2 F (36.8 C)  TempSrc: Oral Oral Oral Oral  SpO2: 100%  100% 100%  Weight:      Height:        Examination:  GENERAL: No apparent distress.  Nontoxic. HEENT: MMM.  Vision and hearing grossly intact.  NECK: Supple.  No apparent JVD.  RESP:  No IWOB.  Fair aeration bilaterally. CVS:  RRR. Heart sounds normal.  ABD/GI/GU: BS+. Abd soft, NTND.  MSK/EXT:  Moves extremities. No apparent deformity. No edema.  SKIN: no apparent skin lesion or wound NEURO:  Awake, alert and oriented appropriately.  No apparent focal neuro deficit. PSYCH: Calm. Normal affect.   Procedures:  None  Microbiology summarized: None  Assessment and plan: Principal Problem:   Acute pancreatitis without infection or necrosis Active Problems:   Hypokalemia   Hypomagnesemia   Alcohol use  Acute alcoholic pancreatitis without infection or necrosis: Presents with nausea, vomiting, epigastric abdominal pain and lightheadedness.   Lipase elevated to 1555 and improving.   LFT consistent with alcohol and improving.  MRCP with severe acute pancreatitis without complication and pancreatic ductal calcification.  Still with significant pain but improved.  Tender to palpation. -Renew IV fluid-LR with KCl at 125 cc an hour. -Start clear liquid diet -Start PPI -Multimodal pain control with oxycodone and IV Dilaudid -Encouraged mobility -Monitor LFT, lipase and pain. -GI recommended outpatient follow-up for pancreatic ductal calcification, and signed off.  Elevated liver enzymes/hyperbilirubinemia: Resolving. -Continue monitoring  Hypokalemia/hypomagnesemia: Likely from GI loss.  Improved. -Monitor replenish as appropriate   Alcohol abuse: Reports drinking 2-3 shots of liquor on the days off.  No withdrawal symptoms. -Encouraged alcohol cessation.  Mild hyponatremia: -Monitor.  Body mass index is 26.63 kg/m.          DVT prophylaxis:  heparin injection 5,000 Units Start: 10/29/22 2200  Code Status: Full code Family Communication: None at bedside Level of care: Med-Surg Status is: Inpatient Remains inpatient appropriate because: Acute pancreatitis   Final disposition: Home once medically stable Consultants:  GI  35 minutes with more than 50% spent in reviewing records, counseling patient/family and coordinating care.   Sch Meds:  Scheduled Meds:  heparin  5,000 Units Subcutaneous Q8H   pantoprazole (PROTONIX) IV  40 mg Intravenous Q24H    Continuous Infusions:  lactated ringers 1,000 mL with potassium chloride 20 mEq infusion 125 mL/hr at 10/30/22 1159   PRN Meds:.HYDROmorphone (DILAUDID) injection, ondansetron **OR** ondansetron (ZOFRAN) IV, oxyCODONE  Antimicrobials: Anti-infectives (From admission, onward)    None        I have personally reviewed the following labs and images: CBC: Recent Labs  Lab 10/29/22 1534 10/30/22 0803  WBC 8.8 7.6  NEUTROABS 7.2  --   HGB 12.7 11.5*  HCT 37.4 32.9*  MCV 86.6 85.7  PLT 208 161   BMP &GFR Recent Labs  Lab 10/29/22 1534 10/29/22 1547 10/30/22 0803  NA 136  --  134*  K 2.4*  --  3.4*  CL 95*  --  97*  CO2 17*  --  27  GLUCOSE 119*  --  77  BUN 5*  --  <5*  CREATININE 0.78  --  0.66  CALCIUM 9.6  --  8.8*  MG  --  1.3* 1.9   Estimated Creatinine Clearance: 89.5 mL/min (by C-G formula based on SCr of 0.66 mg/dL). Liver & Pancreas: Recent Labs  Lab 10/29/22 1534 10/30/22 0803  AST 199* 95*  ALT 56* 40  ALKPHOS 115 103  BILITOT 1.4* 1.1  PROT 8.3* 7.0  ALBUMIN 4.1 3.3*   Recent Labs  Lab 10/29/22 1534 10/30/22 0803  LIPASE 1,555* 948*   No results for input(s): "AMMONIA" in the last 168 hours. Diabetic: No results for input(s): "HGBA1C" in the last 72 hours. No results for input(s): "GLUCAP" in the last 168 hours. Cardiac Enzymes: No results for input(s): "CKTOTAL", "CKMB", "CKMBINDEX", "TROPONINI" in the last 168 hours. No results for input(s): "PROBNP" in the last 8760 hours. Coagulation Profile: No results for input(s): "INR", "PROTIME" in the last 168 hours. Thyroid Function Tests: No results for input(s): "TSH", "T4TOTAL", "FREET4", "T3FREE", "THYROIDAB" in the last 72 hours. Lipid Profile: No results for input(s): "CHOL", "HDL", "LDLCALC", "TRIG", "CHOLHDL", "LDLDIRECT" in the last 72 hours. Anemia Panel: No results for input(s): "VITAMINB12", "FOLATE", "FERRITIN", "TIBC", "IRON", "RETICCTPCT" in the last 72 hours. Urine  analysis:    Component Value Date/Time   COLORURINE AMBER (A) 10/29/2022 1532   APPEARANCEUR HAZY (A) 10/29/2022 1532   LABSPEC 1.025 10/29/2022 1532   PHURINE 6.0 10/29/2022 1532   GLUCOSEU NEGATIVE 10/29/2022 1532   HGBUR MODERATE (A) 10/29/2022 1532   BILIRUBINUR SMALL (A) 10/29/2022 1532   KETONESUR 80 (A) 10/29/2022 1532   PROTEINUR 100 (A) 10/29/2022 1532   UROBILINOGEN 0.2 06/09/2014 1611   NITRITE NEGATIVE 10/29/2022 1532   LEUKOCYTESUR MODERATE (A) 10/29/2022 1532   Sepsis Labs: Invalid input(s): "PROCALCITONIN", "LACTICIDVEN"  Microbiology: No results found for this or any previous visit (from the past 240 hour(s)).  Radiology Studies: MR ABDOMEN MRCP W WO CONTAST  Result Date: 10/30/2022 CLINICAL DATA:  47 year old female with history of pancreatitis. EXAM: MRI ABDOMEN WITHOUT AND WITH CONTRAST (INCLUDING MRCP) TECHNIQUE: Multiplanar multisequence MR imaging of the abdomen was performed both before and after the administration of intravenous contrast. Heavily T2-weighted images of the biliary and pancreatic ducts were obtained, and three-dimensional MRCP images were rendered by post processing. CONTRAST:  49mL GADAVIST GADOBUTROL 1 MMOL/ML IV SOLN COMPARISON:  No prior abdominal MRI. CT of the abdomen and pelvis  10/29/2022. FINDINGS: Lower chest: Unremarkable. Hepatobiliary: Tiny subcentimeter T1 hypointense, T2 hyperintense, nonenhancing lesion in the periphery of segment 4A of the liver, compatible with a tiny cyst or biliary hamartoma (no imaging follow-up recommended). No other aggressive appearing cystic or solid hepatic lesions are noted. No intra or extrahepatic biliary ductal dilatation noted on MRCP images. Gallbladder is unremarkable in appearance. Common bile duct measures only 2 mm in the porta hepatis. No filling defect in the common bile duct to suggest choledocholithiasis. Pancreas: Diffusely increased T2 signal intensity infiltrating throughout the pancreatic  parenchyma and in the surrounding soft tissues of the retroperitoneum, tracking caudally in the pericolic gutters bilaterally, as well as tracking caudally into the root of the small bowel mesentery. No well organized peripancreatic fluid collection to suggest pancreatic pseudocyst at this time. Pancreatic parenchyma enhances normally. No pancreatic ductal dilatation on MRCP images. However, there does appear to be a small filling defect in the pancreatic duct in the region of the pancreatic head, best appreciated on coronal MRCP image 48 of series 11, corresponding to suspected calculus on prior CT examination, estimated to measure approximately 4 mm in diameter. Spleen:  Unremarkable. Adrenals/Urinary Tract: Bilateral kidneys and adrenal glands are normal in appearance. No hydroureteronephrosis in the visualized portions of the abdomen. Stomach/Bowel: Visualized portions are unremarkable. Vascular/Lymphatic: No aneurysm identified in the visualized abdominal vasculature. No lymphadenopathy noted in the abdomen. Other: Small volume of ascites. Large volume of peripancreatic fluid and fluid tracking into the root of the small bowel mesentery. Musculoskeletal: No aggressive appearing osseous lesions are noted in the visualized portions of the skeleton. IMPRESSION: 1. Severe acute pancreatitis, as above. No evidence of pancreatic necrosis or definite pseudocyst at this time. There is a probable pancreatic ductal calcification in the region of the pancreatic head shortly before the level of the ampulla. Correlation with ERCP could be considered if clinically appropriate. 2. No choledocholithiasis or biliary tract obstruction. Electronically Signed   By: Vinnie Langton M.D.   On: 10/30/2022 05:39   CT ABDOMEN PELVIS W WO CONTRAST  Result Date: 10/29/2022 CLINICAL DATA:  Left lower quadrant abdominal pain EXAM: CT ABDOMEN AND PELVIS WITHOUT AND WITH CONTRAST TECHNIQUE: Multidetector CT imaging of the abdomen and  pelvis was performed following the standard protocol before and following the bolus administration of intravenous contrast. RADIATION DOSE REDUCTION: This exam was performed according to the departmental dose-optimization program which includes automated exposure control, adjustment of the mA and/or kV according to patient size and/or use of iterative reconstruction technique. CONTRAST:  48mL OMNIPAQUE IOHEXOL 350 MG/ML SOLN COMPARISON:  None Available. FINDINGS: Lower chest: No acute abnormality. Hepatobiliary: No focal liver abnormality is seen. No gallstones, gallbladder wall thickening, or biliary dilatation. Pancreas: Edematous pancreas with surrounding fluid and inflammatory change. No organized peripancreatic fluid collections. Calcification of the pancreatic head measuring 3 mm seen on series 11, image 28. No main pancreatic duct dilation. Spleen: Normal in size without focal abnormality. Adrenals/Urinary Tract: Bilateral adrenal glands are unremarkable. No hydronephrosis or nephrolithiasis. Bladder is unremarkable. Stomach/Bowel: Stomach is within normal limits. Appendix appears normal. No evidence of bowel wall thickening, distention, or inflammatory changes. Vascular/Lymphatic: Mild aortic atherosclerosis. No enlarged abdominal or pelvic lymph nodes. Reproductive: Uterus and bilateral adnexa are unremarkable. Other: No abdominal wall hernia or abnormality. No abdominopelvic ascites. Musculoskeletal: No acute or significant osseous findings. IMPRESSION: 1. Findings compatible with acute uncomplicated pancreatitis. 2. Calcification of the pancreatic head measuring 3 mm, possibly a distal common bile duct stone or pancreatic parenchymal  calcification. MRCP could be performed for further evaluation. 3. Aortic Atherosclerosis (ICD10-I70.0). Electronically Signed   By: Allegra Lai M.D.   On: 10/29/2022 19:48      Avyan Livesay T. Keashia Haskins [facility]  If 7PM-7AM, please contact  night-coverage [email or web address] 10/30/2022, 12:27 PM

## 2022-10-31 DIAGNOSIS — K858 Other acute pancreatitis without necrosis or infection: Secondary | ICD-10-CM | POA: Diagnosis not present

## 2022-10-31 LAB — COMPREHENSIVE METABOLIC PANEL
ALT: 32 U/L (ref 0–44)
AST: 74 U/L — ABNORMAL HIGH (ref 15–41)
Albumin: 3 g/dL — ABNORMAL LOW (ref 3.5–5.0)
Alkaline Phosphatase: 93 U/L (ref 38–126)
Anion gap: 14 (ref 5–15)
BUN: <5 mg/dL — ABNORMAL LOW (ref 6–20)
CO2: 26 mmol/L (ref 22–32)
Calcium: 8.3 mg/dL — ABNORMAL LOW (ref 8.9–10.3)
Chloride: 95 mmol/L — ABNORMAL LOW (ref 98–111)
Creatinine, Ser: 0.8 mg/dL (ref 0.44–1.00)
GFR, Estimated: >60 mL/min (ref >60)
Glucose, Bld: 70 mg/dL (ref 70–99)
Potassium: 3.1 mmol/L — ABNORMAL LOW (ref 3.5–5.1)
Sodium: 135 mmol/L (ref 135–145)
Total Bilirubin: 1.4 mg/dL — ABNORMAL HIGH (ref 0.3–1.2)
Total Protein: 6.4 g/dL — ABNORMAL LOW (ref 6.5–8.1)

## 2022-10-31 LAB — CBC
HCT: 31.7 % — ABNORMAL LOW (ref 36.0–46.0)
Hemoglobin: 11 g/dL — ABNORMAL LOW (ref 12.0–15.0)
MCH: 30.1 pg (ref 26.0–34.0)
MCHC: 34.7 g/dL (ref 30.0–36.0)
MCV: 86.6 fL (ref 80.0–100.0)
Platelets: 129 10*3/uL — ABNORMAL LOW (ref 150–400)
RBC: 3.66 MIL/uL — ABNORMAL LOW (ref 3.87–5.11)
RDW: 17.1 % — ABNORMAL HIGH (ref 11.5–15.5)
WBC: 7.7 10*3/uL (ref 4.0–10.5)
nRBC: 0 % (ref 0.0–0.2)

## 2022-10-31 LAB — LIPASE, BLOOD: Lipase: 509 U/L — ABNORMAL HIGH (ref 11–51)

## 2022-10-31 LAB — MAGNESIUM: Magnesium: 1.6 mg/dL — ABNORMAL LOW (ref 1.7–2.4)

## 2022-10-31 MED ORDER — POTASSIUM CHLORIDE CRYS ER 20 MEQ PO TBCR
40.0000 meq | EXTENDED_RELEASE_TABLET | ORAL | Status: AC
  Administered 2022-10-31 (×2): 40 meq via ORAL
  Filled 2022-10-31 (×2): qty 2

## 2022-10-31 MED ORDER — MAGNESIUM SULFATE 4 GM/100ML IV SOLN
4.0000 g | Freq: Once | INTRAVENOUS | Status: AC
  Administered 2022-10-31: 4 g via INTRAVENOUS
  Filled 2022-10-31: qty 100

## 2022-10-31 NOTE — Progress Notes (Signed)
  Progress Note   Patient: Sydney Dalton LOV:[id number] DOB: 11-Aug-1976 DOA: 10/29/2022     2 DOS: the patient was seen and examined on 10/31/2022   Brief hospital course: 47 year old F with PMH of alcohol use presenting with nausea, vomiting, epigastric abdominal pain and near syncope and admitted for acute uncomplicated alcoholic pancreatitis.  Reports drinking 2-3 shots of liquor on her days of but admits to increased alcohol use recently due to social stressors.  Lipase elevated to 1555.  CT abdomen showed uncomplicated acute pancreatitis with calcification of the pancreatic head measuring about 3 mm.  GI consulted and recommended MRCP, that showed severe acute pancreatitis without complication but with probable pancreatic ductal calcification in the region of the pancreatic head.  No choledocholithiasis or biliary tract obstruction.  GI recommended outpatient follow-up and signed off.    Assessment and Plan: Acute alcoholic pancreatitis without infection or necrosis: Presents with nausea, vomiting, epigastric abdominal pain and lightheadedness.   Lipase elevated to 1555 and improving.   LFT consistent with alcohol and improving.  MRCP with severe acute pancreatitis without complication and pancreatic ductal calcification.   -reports symptoms improved, tolerating clears. Asking to try full liquids -Start PPI -Continue analgesia as needed -Encouraged mobility -Monitor LFT, lipase and pain. -GI recommended outpatient follow-up for pancreatic ductal calcification, and signed off.   Elevated liver enzymes/hyperbilirubinemia:  -LFT's improving -Continue monitoring   Hypokalemia/hypomagnesemia: Likely from GI loss.  -K and Mg low this AM, replaced   Alcohol abuse: Reports drinking 2-3 shots of liquor on the days off.  No withdrawal symptoms. -Encouraged alcohol cessation.   Mild hyponatremia: -Monitor.      Subjective: Eager to advance diet  Physical Exam: Vitals:   10/30/22 1558  10/30/22 2020 10/31/22 0415 10/31/22 0826  BP: 137/81 135/80 134/79 135/81  Pulse: 99 86 86 84  Resp: 16 16 17 17   Temp: 98.9 F (37.2 C) 98.3 F (36.8 C) 99 F (37.2 C) 99.2 F (37.3 C)  TempSrc: Oral Oral Oral Oral  SpO2: 100% 100% 100% 100%  Weight:      Height:       General exam: Awake, laying in bed, in nad Respiratory system: Normal respiratory effort, no wheezing Cardiovascular system: regular rate, s1, s2 Gastrointestinal system: Soft, nondistended, positive BS Central nervous system: CN2-12 grossly intact, strength intact Extremities: Perfused, no clubbing Skin: Normal skin turgor, no notable skin lesions seen Psychiatry: Mood normal // no visual hallucinations   Data Reviewed:  Labs reviewed: Na 135, K 3.1, Cr 0.80, Hgb 11.0  Family Communication: Pt in room, family not at bedside  Disposition: Status is: Inpatient Remains inpatient appropriate because: Severity of illness  Planned Discharge Destination: Home    Author: Marylu Lund, MD 10/31/2022 2:40 PM  For on call review [email or web address].

## 2022-10-31 NOTE — Progress Notes (Signed)
[facility] night cross cover note:  I was notified by RN that the patient is refusing her subcutaneous heparin injection for DVT prophylaxis purposes this evening, and that she, similarly, refused her subcu heparin injection during dayshift.   RN conveys that the patient is ambulatory, and frequently walks in her room, without assistance.    Babs Bertin, DO Hospitalist

## 2022-10-31 NOTE — Progress Notes (Signed)
Patient is refusing Heparin injections this shift and has refused on prior shift. Hospitalist Babs Bertin was notified of this and made aware patient is able to walk and independent in the room.

## 2022-11-01 DIAGNOSIS — K858 Other acute pancreatitis without necrosis or infection: Secondary | ICD-10-CM | POA: Diagnosis not present

## 2022-11-01 LAB — CBC
HCT: 31.3 % — ABNORMAL LOW (ref 36.0–46.0)
Hemoglobin: 10.7 g/dL — ABNORMAL LOW (ref 12.0–15.0)
MCH: 29.7 pg (ref 26.0–34.0)
MCHC: 34.2 g/dL (ref 30.0–36.0)
MCV: 86.9 fL (ref 80.0–100.0)
Platelets: 127 10*3/uL — ABNORMAL LOW (ref 150–400)
RBC: 3.6 MIL/uL — ABNORMAL LOW (ref 3.87–5.11)
RDW: 16.5 % — ABNORMAL HIGH (ref 11.5–15.5)
WBC: 5.8 10*3/uL (ref 4.0–10.5)
nRBC: 0 % (ref 0.0–0.2)

## 2022-11-01 LAB — COMPREHENSIVE METABOLIC PANEL
ALT: 27 U/L (ref 0–44)
AST: 57 U/L — ABNORMAL HIGH (ref 15–41)
Albumin: 2.9 g/dL — ABNORMAL LOW (ref 3.5–5.0)
Alkaline Phosphatase: 98 U/L (ref 38–126)
Anion gap: 11 (ref 5–15)
BUN: <5 mg/dL — ABNORMAL LOW (ref 6–20)
CO2: 26 mmol/L (ref 22–32)
Calcium: 8.2 mg/dL — ABNORMAL LOW (ref 8.9–10.3)
Chloride: 96 mmol/L — ABNORMAL LOW (ref 98–111)
Creatinine, Ser: 0.5 mg/dL (ref 0.44–1.00)
GFR, Estimated: >60 mL/min (ref >60)
Glucose, Bld: 97 mg/dL (ref 70–99)
Potassium: 3.1 mmol/L — ABNORMAL LOW (ref 3.5–5.1)
Sodium: 133 mmol/L — ABNORMAL LOW (ref 135–145)
Total Bilirubin: 0.9 mg/dL (ref 0.3–1.2)
Total Protein: 6.3 g/dL — ABNORMAL LOW (ref 6.5–8.1)

## 2022-11-01 LAB — MAGNESIUM: Magnesium: 2 mg/dL (ref 1.7–2.4)

## 2022-11-01 MED ORDER — PANTOPRAZOLE SODIUM 40 MG PO TBEC
40.0000 mg | DELAYED_RELEASE_TABLET | Freq: Every day | ORAL | Status: DC
  Administered 2022-11-01: 40 mg via ORAL
  Filled 2022-11-01: qty 1

## 2022-11-01 MED ORDER — POTASSIUM CHLORIDE CRYS ER 20 MEQ PO TBCR
60.0000 meq | EXTENDED_RELEASE_TABLET | ORAL | Status: AC
  Administered 2022-11-01 (×2): 60 meq via ORAL
  Filled 2022-11-01 (×2): qty 3

## 2022-11-01 NOTE — Progress Notes (Signed)
Patient care was discussed between the hospitalist and Dr. Carita Pian earlier this week.  They determined that she could follow-up as outpatient as long as she improved well since MRI did not show biliary obstruction and LFTs ok.  Will arrange for her to follow-up in our office as she will likely be discharged later today.

## 2022-11-01 NOTE — Discharge Summary (Signed)
Physician Discharge Summary   Patient: Sydney Dalton MRN: [id number] DOB: 1975-10-27  Admit date:     10/29/2022  Discharge date: 11/01/22  Discharge Physician: Rickey Barbara   PCP: Patient, No Pcp Per   Recommendations at discharge:    Follow up with PCP in 1-2 weeks Follow up with GI as scheduled  Discharge Diagnoses: Principal Problem:   Acute pancreatitis without infection or necrosis Active Problems:   Hypokalemia   Hypomagnesemia   Alcohol use  Resolved Problems:   * No resolved hospital problems. *  Hospital Course: 47 year old F with PMH of alcohol use presenting with nausea, vomiting, epigastric abdominal pain and near syncope and admitted for acute uncomplicated alcoholic pancreatitis.  Reports drinking 2-3 shots of liquor on her days of but admits to increased alcohol use recently due to social stressors.  Lipase elevated to 1555.  CT abdomen showed uncomplicated acute pancreatitis with calcification of the pancreatic head measuring about 3 mm.  GI consulted and recommended MRCP, that showed severe acute pancreatitis without complication but with probable pancreatic ductal calcification in the region of the pancreatic head.  No choledocholithiasis or biliary tract obstruction.  GI recommended outpatient follow-up and signed off.    Assessment and Plan: Acute alcoholic pancreatitis without infection or necrosis: Presents with nausea, vomiting, epigastric abdominal pain and lightheadedness.   Lipase elevated to 1555 and improving.   LFT consistent with alcohol and improving.  MRCP with severe acute pancreatitis without complication and pancreatic ductal calcification.   Symptoms have since much improved and pt's diet was successfully advanced to soft -GI recommended outpatient follow-up for pancreatic ductal calcification, and signed off earlier this visit. Discussed again with GI on 1/25 who will arrange f/u appointment   Elevated liver enzymes/hyperbilirubinemia:  Resolving.   Hypokalemia/hypomagnesemia: Likely from GI loss.  Improved. -Monitor replenish as appropriate   Alcohol abuse: Reports drinking 2-3 shots of liquor on the days off.  No withdrawal symptoms. -Encouraged alcohol cessation.   Mild hyponatremia: -remained stable       Consultants: GI Procedures performed:   Disposition: Home Diet recommendation:  Regular diet DISCHARGE MEDICATION: Allergies as of 11/01/2022   No Known Allergies      Medication List    You have not been prescribed any medications.     Follow-up Information     [facility] Follow up.   Specialty: Gastroenterology Why: Hospital follow up, as scheduled Contact information: [address] [place] [id number] [phone]        Follow up with your PCP in 1-2 weeks Follow up.   Why: Hospital follow up               Discharge Exam: Filed Weights   10/29/22 1510  Weight: 72.6 kg   General exam: Awake, laying in bed, in nad Respiratory system: Normal respiratory effort, no wheezing Cardiovascular system: regular rate, s1, s2 Gastrointestinal system: Soft, nondistended, positive BS Central nervous system: CN2-12 grossly intact, strength intact Extremities: Perfused, no clubbing Skin: Normal skin turgor, no notable skin lesions seen Psychiatry: Mood normal // no visual hallucinations   Condition at discharge: fair  The results of significant diagnostics from this hospitalization (including imaging, microbiology, ancillary and laboratory) are listed below for reference.   Imaging Studies: MR ABDOMEN MRCP W WO CONTAST  Result Date: 10/30/2022 CLINICAL DATA:  47 year old female with history of pancreatitis. EXAM: MRI ABDOMEN WITHOUT AND WITH CONTRAST (INCLUDING MRCP) TECHNIQUE: Multiplanar multisequence MR imaging of the abdomen was  performed both before and after the administration of intravenous contrast. Heavily T2-weighted images of  the biliary and pancreatic ducts were obtained, and three-dimensional MRCP images were rendered by post processing. CONTRAST:  8mL GADAVIST GADOBUTROL 1 MMOL/ML IV SOLN COMPARISON:  No prior abdominal MRI. CT of the abdomen and pelvis 10/29/2022. FINDINGS: Lower chest: Unremarkable. Hepatobiliary: Tiny subcentimeter T1 hypointense, T2 hyperintense, nonenhancing lesion in the periphery of segment 4A of the liver, compatible with a tiny cyst or biliary hamartoma (no imaging follow-up recommended). No other aggressive appearing cystic or solid hepatic lesions are noted. No intra or extrahepatic biliary ductal dilatation noted on MRCP images. Gallbladder is unremarkable in appearance. Common bile duct measures only 2 mm in the porta hepatis. No filling defect in the common bile duct to suggest choledocholithiasis. Pancreas: Diffusely increased T2 signal intensity infiltrating throughout the pancreatic parenchyma and in the surrounding soft tissues of the retroperitoneum, tracking caudally in the pericolic gutters bilaterally, as well as tracking caudally into the root of the small bowel mesentery. No well organized peripancreatic fluid collection to suggest pancreatic pseudocyst at this time. Pancreatic parenchyma enhances normally. No pancreatic ductal dilatation on MRCP images. However, there does appear to be a small filling defect in the pancreatic duct in the region of the pancreatic head, best appreciated on coronal MRCP image 48 of series 11, corresponding to suspected calculus on prior CT examination, estimated to measure approximately 4 mm in diameter. Spleen:  Unremarkable. Adrenals/Urinary Tract: Bilateral kidneys and adrenal glands are normal in appearance. No hydroureteronephrosis in the visualized portions of the abdomen. Stomach/Bowel: Visualized portions are unremarkable. Vascular/Lymphatic: No aneurysm identified in the visualized abdominal vasculature. No lymphadenopathy noted in the abdomen. Other:  Small volume of ascites. Large volume of peripancreatic fluid and fluid tracking into the root of the small bowel mesentery. Musculoskeletal: No aggressive appearing osseous lesions are noted in the visualized portions of the skeleton. IMPRESSION: 1. Severe acute pancreatitis, as above. No evidence of pancreatic necrosis or definite pseudocyst at this time. There is a probable pancreatic ductal calcification in the region of the pancreatic head shortly before the level of the ampulla. Correlation with ERCP could be considered if clinically appropriate. 2. No choledocholithiasis or biliary tract obstruction. Electronically Signed   By: Vinnie Langton M.D.   On: 10/30/2022 05:39   CT ABDOMEN PELVIS W WO CONTRAST  Result Date: 10/29/2022 CLINICAL DATA:  Left lower quadrant abdominal pain EXAM: CT ABDOMEN AND PELVIS WITHOUT AND WITH CONTRAST TECHNIQUE: Multidetector CT imaging of the abdomen and pelvis was performed following the standard protocol before and following the bolus administration of intravenous contrast. RADIATION DOSE REDUCTION: This exam was performed according to the departmental dose-optimization program which includes automated exposure control, adjustment of the mA and/or kV according to patient size and/or use of iterative reconstruction technique. CONTRAST:  68mL OMNIPAQUE IOHEXOL 350 MG/ML SOLN COMPARISON:  None Available. FINDINGS: Lower chest: No acute abnormality. Hepatobiliary: No focal liver abnormality is seen. No gallstones, gallbladder wall thickening, or biliary dilatation. Pancreas: Edematous pancreas with surrounding fluid and inflammatory change. No organized peripancreatic fluid collections. Calcification of the pancreatic head measuring 3 mm seen on series 11, image 28. No main pancreatic duct dilation. Spleen: Normal in size without focal abnormality. Adrenals/Urinary Tract: Bilateral adrenal glands are unremarkable. No hydronephrosis or nephrolithiasis. Bladder is  unremarkable. Stomach/Bowel: Stomach is within normal limits. Appendix appears normal. No evidence of bowel wall thickening, distention, or inflammatory changes. Vascular/Lymphatic: Mild aortic atherosclerosis. No enlarged abdominal or pelvic lymph  nodes. Reproductive: Uterus and bilateral adnexa are unremarkable. Other: No abdominal wall hernia or abnormality. No abdominopelvic ascites. Musculoskeletal: No acute or significant osseous findings. IMPRESSION: 1. Findings compatible with acute uncomplicated pancreatitis. 2. Calcification of the pancreatic head measuring 3 mm, possibly a distal common bile duct stone or pancreatic parenchymal calcification. MRCP could be performed for further evaluation. 3. Aortic Atherosclerosis (ICD10-I70.0). Electronically Signed   By: Yetta Glassman M.D.   On: 10/29/2022 19:48    Microbiology: No results found for this or any previous visit.  Labs: CBC: Recent Labs  Lab 10/29/22 1534 10/30/22 0803 10/31/22 0737 11/01/22 0650  WBC 8.8 7.6 7.7 5.8  NEUTROABS 7.2  --   --   --   HGB 12.7 11.5* 11.0* 10.7*  HCT 37.4 32.9* 31.7* 31.3*  MCV 86.6 85.7 86.6 86.9  PLT 208 161 129* 751*   Basic Metabolic Panel: Recent Labs  Lab 10/29/22 1534 10/29/22 1547 10/30/22 0803 10/31/22 0737 11/01/22 0650  NA 136  --  134* 135 133*  K 2.4*  --  3.4* 3.1* 3.1*  CL 95*  --  97* 95* 96*  CO2 17*  --  27 26 26   GLUCOSE 119*  --  77 70 97  BUN 5*  --  <5* <5* <5*  CREATININE 0.78  --  0.66 0.80 0.50  CALCIUM 9.6  --  8.8* 8.3* 8.2*  MG  --  1.3* 1.9 1.6* 2.0   Liver Function Tests: Recent Labs  Lab 10/29/22 1534 10/30/22 0803 10/31/22 0737 11/01/22 0650  AST 199* 95* 74* 57*  ALT 56* 40 32 27  ALKPHOS 115 103 93 98  BILITOT 1.4* 1.1 1.4* 0.9  PROT 8.3* 7.0 6.4* 6.3*  ALBUMIN 4.1 3.3* 3.0* 2.9*   CBG: No results for input(s): "GLUCAP" in the last 168 hours.  Discharge time spent: less than 30 minutes.  Signed: Marylu Lund, MD [facility] 11/01/2022

## 2022-12-26 ENCOUNTER — Other Ambulatory Visit (INDEPENDENT_AMBULATORY_CARE_PROVIDER_SITE_OTHER): Payer: Medicaid Other

## 2022-12-26 ENCOUNTER — Encounter: Payer: Self-pay | Admitting: Gastroenterology

## 2022-12-26 ENCOUNTER — Ambulatory Visit (INDEPENDENT_AMBULATORY_CARE_PROVIDER_SITE_OTHER): Payer: Medicaid Other | Admitting: Gastroenterology

## 2022-12-26 VITALS — BP 118/80 | HR 77 | Ht 65.5 in | Wt 136.0 lb

## 2022-12-26 DIAGNOSIS — Z8719 Personal history of other diseases of the digestive system: Secondary | ICD-10-CM | POA: Diagnosis not present

## 2022-12-26 DIAGNOSIS — R194 Change in bowel habit: Secondary | ICD-10-CM | POA: Diagnosis not present

## 2022-12-26 DIAGNOSIS — F1010 Alcohol abuse, uncomplicated: Secondary | ICD-10-CM | POA: Diagnosis not present

## 2022-12-26 DIAGNOSIS — R935 Abnormal findings on diagnostic imaging of other abdominal regions, including retroperitoneum: Secondary | ICD-10-CM

## 2022-12-26 LAB — COMPREHENSIVE METABOLIC PANEL
ALT: 26 U/L (ref 0–35)
AST: 65 U/L — ABNORMAL HIGH (ref 0–37)
Albumin: 3.7 g/dL (ref 3.5–5.2)
Alkaline Phosphatase: 106 U/L (ref 39–117)
BUN: 6 mg/dL (ref 6–23)
CO2: 28 mEq/L (ref 19–32)
Calcium: 9.1 mg/dL (ref 8.4–10.5)
Chloride: 101 mEq/L (ref 96–112)
Creatinine, Ser: 0.48 mg/dL (ref 0.40–1.20)
GFR: 114.91 mL/min (ref >60.00)
Glucose, Bld: 97 mg/dL (ref 70–99)
Potassium: 3.1 mEq/L — ABNORMAL LOW (ref 3.5–5.1)
Sodium: 140 mEq/L (ref 135–145)
Total Bilirubin: 0.3 mg/dL (ref 0.2–1.2)
Total Protein: 7.9 g/dL (ref 6.0–8.3)

## 2022-12-26 LAB — C-REACTIVE PROTEIN: CRP: <1 mg/dL (ref 0.5–20.0)

## 2022-12-26 LAB — LIPASE: Lipase: 92 U/L — ABNORMAL HIGH (ref 11.0–59.0)

## 2022-12-26 LAB — CBC
HCT: 34.6 % — ABNORMAL LOW (ref 36.0–46.0)
Hemoglobin: 11.5 g/dL — ABNORMAL LOW (ref 12.0–15.0)
MCHC: 33.4 g/dL (ref 30.0–36.0)
MCV: 92.7 fl (ref 78.0–100.0)
Platelets: 267 10*3/uL (ref 150.0–400.0)
RBC: 3.73 Mil/uL — ABNORMAL LOW (ref 3.87–5.11)
RDW: 18.2 % — ABNORMAL HIGH (ref 11.5–15.5)
WBC: 4.5 10*3/uL (ref 4.0–10.5)

## 2022-12-26 LAB — SEDIMENTATION RATE: Sed Rate: 60 mm/hr — ABNORMAL HIGH (ref 0–20)

## 2022-12-26 LAB — TSH: TSH: 1.01 u[IU]/mL (ref 0.35–5.50)

## 2022-12-26 LAB — AMYLASE: Amylase: 40 U/L (ref 27–131)

## 2022-12-26 NOTE — Progress Notes (Signed)
[facility] VISIT   Primary Care Provider Patient, No Pcp Per No address on file None  Referring Provider No referring provider defined for this encounter.   Patient Profile: Sydney Dalton is a 47 y.o. female with a pmh significant for pancreatitis (?  Alcohol induced versus idiopathic).  The patient presents to the [facility] for an evaluation and management of problem(s) noted below:  Problem List 1. Hx of pancreatitis   2. Abnormal CT of the abdomen   3. Alcohol abuse   4. Change in bowel habits     History of Present Illness This is the patient's first visit to the outpatient [place] clinic.  The patient was recently admitted in January of this year with findings of pancreatitis.  She tells me that over the course of the last couple of years she has had intermittent GI issues.  Starting around Thanksgiving however she began to experience a "hunger pain" that would occur at different times of the day.  As long as she would eat she would have improvement of her pain but as soon as the food had traversed her stomach she would begin to experience abdominal pain more frequently.  She developed issues of nausea and vomiting as well as a postnasal drip in January with progressive abdominal pain that generalized throughout her abdomen and into her back.  She came in for further evaluation and was found to have pancreatitis.  The patient states that her alcohol consumption has varied over the years but she is a regular alcohol consumer.  She drinks at least 1 if not 2 spirits for quite a significant period of time and she is now down to 1 spirit per day.  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  She states her bowel habits also have changed for the last year and a half where she previously had solid bowel movements and now has a relatively quick gastrocolic reflex and will use the  restroom at least 2 if not 4 times per day.  The stools are looser and softer in quality, "fluffy in appearance".  She is never noted any blood in her stools.  She has had decreased appetite but her weight has actually been climbing over the last few months.  She continues to work.  There is no family history of any GI disorders or pancreas problems or GI malignancies that she is aware of.  She has never had an upper or lower endoscopy.  She wants to get better.  Thankfully over the course of the last few weeks she has been doing better in regards to her abdominal discomfort.  GI Review of Systems Positive as above Negative for dysphagia, odynophagia, melena, hematochezia  Review of Systems General: Denies fevers/chills/weight loss unintentionally Cardiovascular: Denies chest pain Pulmonary: Denies shortness of breath Gastroenterological: See HPI Genitourinary: Denies darkened urine Hematological: Denies easy bruising/bleeding Endocrine: Denies temperature intolerance Dermatological: Denies jaundice Psychological: Mood is stable   Medications No current outpatient medications on file.   No current facility-administered medications for this visit.    Allergies No Known Allergies  Histories History reviewed. No pertinent past medical history. History reviewed. No pertinent surgical history. Social History   Socioeconomic History   Marital status: Single    Spouse name: Not on file   Number of children: 1   Years of education: Not on file   Highest education level: Not on file  Occupational History   Occupation: Works at [employer]  Tobacco Use   Smoking status: Some Days    Types: Cigarettes   Smokeless tobacco: Not on file  Vaping Use   Vaping Use: Never used  Substance and Sexual Activity   Alcohol use: Yes    Comment: occ   Drug use: Never   Sexual activity: Not on file  Other Topics Concern   Not on file  Social History Narrative   Not on file   Social  Determinants of Health   Financial Resource Strain: Not on file  Food Insecurity: No Food Insecurity (10/29/2022)   Hunger Vital Sign    Worried About Running Out of Food in the Last Year: Never true    Ran Out of Food in the Last Year: Never true  Transportation Needs: No Transportation Needs (10/29/2022)   PRAPARE - Hydrologist (Medical): No    Lack of Transportation (Non-Medical): No  Physical Activity: Not on file  Stress: Not on file  Social Connections: Not on file  Intimate Partner Violence: Not At Risk (10/29/2022)   Humiliation, Afraid, Rape, and Kick questionnaire    Fear of Current or Ex-Partner: No    Emotionally Abused: No    Physically Abused: No    Sexually Abused: No   Family History  Problem Relation Age of Onset   Dementia Mother    Colon cancer Neg Hx    Pancreatic cancer Neg Hx    Liver cancer Neg Hx    Esophageal cancer Neg Hx    Stomach cancer Neg Hx    Rectal cancer Neg Hx    I have reviewed her medical, social, and family history in detail and updated the electronic medical record as necessary.    PHYSICAL EXAMINATION  BP 118/80   Pulse 77   Ht 5' 5.5" (1.664 m)   Wt 136 lb (61.7 kg)   SpO2 98%   BMI 22.29 kg/m  Wt Readings from Last 3 Encounters:  12/26/22 136 lb (61.7 kg)  10/29/22 160 lb (72.6 kg)  06/09/14 160 lb (72.6 kg)  The patient is adamant however that when she was admitted to the hospital she did not weigh 160 pounds GEN: NAD, appears stated age, doesn't appear chronically ill PSYCH: Cooperative, without pressured speech EYE: Conjunctivae pink, sclerae anicteric ENT: MMM CV: RR without R/Gs  RESP: CTAB posteriorly, without wheezing GI: NABS, soft, NT/ND, without rebound or guarding, no HSM appreciated MSK/EXT: No significant lower extremity edema SKIN: No jaundice NEURO:  Alert & Oriented x 3, no focal deficits   REVIEW OF DATA  I reviewed the following data at the time of this encounter:  GI  Procedures and Studies  No relevant studies to review  Laboratory Studies  Reviewed those in epic  Imaging Studies  January 2024 CT abdomen pelvis with contrast IMPRESSION: 1. Findings compatible with acute uncomplicated pancreatitis. 2. Calcification of the pancreatic head measuring 3 mm, possibly a distal common bile duct stone or pancreatic parenchymal calcification. MRCP could be performed for further evaluation. 3. Aortic Atherosclerosis (ICD10-I70.0).  January 2024 MRI/MRCP IMPRESSION: 1. Severe acute pancreatitis, as above. No evidence of pancreatic necrosis or definite pseudocyst at this time. There is a probable pancreatic ductal calcification in the region of the pancreatic head shortly before the level of the ampulla. Correlation with ERCP could be considered if clinically appropriate. 2. No choledocholithiasis or biliary tract obstruction.    ASSESSMENT  Ms. Troeger is  a 47 y.o. female with a pmh significant for pancreatitis (?  Alcohol induced versus idiopathic).  The patient is seen today for evaluation and management of:  1. Hx of pancreatitis   2. Abnormal CT of the abdomen   3. Alcohol abuse   4. Change in bowel habits    The patient is hemodynamically and clinically stable at this time.  The etiology of the patient's pancreatitis back in January seems to be most likely a result of alcohol use disorder.  The punctate calcification could be suggestion of chronic pancreatitis changes within the parenchyma.  She does not have overt cholelithiasis and her biliary tree looks to be normal so that seems less likely.  She has not been on medication so drug-induced pancreatitis seems less likely.  I have asked the patient to try to minimize alcohol consumption moving forward and she will continue to work on that.  She does use tobacco products with her alcohol use and have asked her to try to minimize and cut back on that as well.  We are going to rule out other etiologies  for pancreatitis such as autoimmune pancreatitis as well as hypertriglyceridemia.  I wonder if the patient has fully healed her pancreatitis, and I think it is likely she has but the question of whether she may have a peripancreatic fluid collection or pseudocyst does still stay in my mind.  Will check her laboratories.  As well with the changes in her bowel habits, I wonder if she truly has chronic pancreatitis and we are dealing with exocrine pancreas insufficiency.  We are going to check a fecal elastase and see where things go from there.  If her bowel movements still remain abnormal and she has normal fecal elastase testing, then we will consider further stool based testing and consider diagnostic colonoscopy.  We did discuss the role of potential endoscopic ultrasound if we have concerns on repeat imaging or if we have other concerns of the calcium deposit near the head of the pancreas.  I would like to repeat her imaging first with a pancreas protocol CT before we plan an EUS or other endoscopic evaluation.  The patient agrees with this plan of action.  All patient questions were answered to the best of my ability, and the patient agrees to the aforementioned plan of action with follow-up as indicated.   PLAN  Laboratories as outlined below Fecal elastase/fecal fat to be obtained to rule out EPI Pancreas protocol CT abdomen pelvis to evaluate the pancreas post severe interstitial pancreatitis in January and rule out other issues Consideration of EUS in future to further evaluate and rule out chronic pancreatitis Consideration of colonoscopy for colon cancer screening later this year and/or if patient continues to have stool changes Consider bulking Benefiber or Metamucil for stools to become more solid/formed   Orders Placed This Encounter  Procedures   CT ABDOMEN PELVIS W CONTRAST   Antinuclear Antib (ANA)   Pancreatic elastase, fecal   Fecal fat, qualitative   Tissue transglutaminase, IgA    IgA   C-reactive protein   Sed Rate (ESR)   TSH   Lipase   Amylase   Comp Met (CMET)   IgG 4   CBC   Lipid panel    New Prescriptions   No medications on file   Modified Medications   No medications on file    Planned Follow Up No follow-ups on file.   Total Time in Face-to-Face and in Coordination of Care for  patient including independent/personal interpretation/review of prior testing, medical history, examination, medication adjustment, communicating results with the patient directly, and documentation within the EHR is 45 minutes.   Justice Britain, MD [facility] # [id number]

## 2022-12-26 NOTE — Patient Instructions (Signed)
You have been scheduled for a CT scan of the abdomen and pelvis at [facility], 1st floor Radiology. You are scheduled on 01/09/2023 at 2:45. You should arrive 15 minutes prior to your appointment time for registration.    Please follow the written instructions below on the day of your exam:   1) Do not eat anything after 1:00pm (4 hours prior to your test)    You may take any medications as prescribed with a small amount of water, if necessary. If you take any of the following medications: METFORMIN, GLUCOPHAGE, GLUCOVANCE, AVANDAMET, RIOMET, FORTAMET, [place] MET, JANUMET, GLUMETZA or METAGLIP, you MAY be asked to HOLD this medication 48 hours AFTER the exam.   The purpose of you drinking the oral contrast is to aid in the visualization of your intestinal tract. The contrast solution may cause some diarrhea. Depending on your individual set of symptoms, you may also receive an intravenous injection of x-ray contrast/dye. Plan on being at [facility] for 45 minutes or longer, depending on the type of exam you are having performed.   If you have any questions regarding your exam or if you need to reschedule, you may call [facility] at [phone] between the hours of 8:00 am and 5:00 pm, Monday-Friday.    Your provider has requested that you go to the basement level for lab work before leaving today. Press "B" on the elevator. The lab is located at the first door on the left as you exit the elevator.    Alcohol Use Disorder Alcohol use disorder is a condition in which drinking disrupts daily life. People with this condition drink too much alcohol and cannot control their drinking. Alcohol use disorder can cause serious problems with physical health. It can affect the brain, heart, and other internal organs. This disorder can raise the risk for certain cancers and cause problems with mental health, such as depression or anxiety. What are the causes? This condition is caused  by drinking too much alcohol over time. Some people with this condition drink to cope with or escape from negative life events. Others drink to relieve symptoms of physical pain or symptoms of mental illness. What increases the risk? You are more likely to develop this condition if: You have a family history of alcohol use disorder. Your culture encourages drinking to the point of becoming drunk (intoxication). You had a mood or conduct disorder in childhood. You have been abused. You are an adolescent and you: Have poor performance in school. Have poor supervision or guidance. Act on impulse and like taking risks. What are the signs or symptoms? Symptoms of this condition include: Drinking more than you want to. Trying several times without success to drink less. Spending a lot of time thinking about alcohol, getting alcohol, drinking alcohol, or recovering from drinking alcohol. Continuing to drink even when it is causing serious problems in your daily life. Drinking when it is dangerous to drink, such as before driving a car. Needing more and more alcohol to get the same effect you want (building up tolerance). Having symptoms of withdrawal when you stop drinking. Withdrawal symptoms may include: Trouble sleeping, leading to tiredness (fatigue). Mood swings of depression and anxiety. Physical symptoms, such as a fast heart rate, rapid breathing, high blood pressure (hypertension), fever, cold sweats, or nausea. Seizures. Severe confusion. Feeling or seeing things that are not there (hallucinations). Shaking movements that you cannot control (tremors). How is this diagnosed? This condition is diagnosed with an  assessment. Your health care provider may start by asking three or four questions about your drinking, or they may give you a simple test to take. This helps to get clear information from you. You may also have a physical exam or lab tests. You may be referred to a substance  abuse counselor. How is this treated? With education, some people with alcohol use disorder are able to reduce their drinking. Many with this disorder cannot change their drinking behavior on their own and need help with treatment from substance use specialists. Treatments may include: Detoxification. Detoxification involves quitting drinking with supervision and direction of health care providers. Your health care provider may prescribe medicines within the first week to help lessen withdrawal symptoms. Alcohol withdrawal can be dangerous and life-threatening. Detoxification may be provided in a home, community, or primary care setting, or in a hospital or substance use treatment facility. Counseling. This may involve motivational interviewing (MI), family therapy, or cognitive behavioral therapy (CBT). A counselor can address the things you can do to change your drinking behavior and how to maintain the changes. Talk therapy aims to: Identify your positive motivations to change. Identify and avoid the things that trigger your drinking. Help you learn how to plan your behavior change. Develop support systems that can help you sustain the change. Medicines. Medicines can help treat this disorder by: Decreasing cravings. Decreasing the positive feeling you have when you drink. Causing an uncomfortable physical reaction when you drink (aversion therapy). Support groups such as Alcoholics Anonymous (AA). These groups are led by people who have quit drinking. The groups provide emotional support, advice, and guidance. Some people with this condition benefit from a combination of treatments provided by specialized substance use treatment centers. Follow these instructions at home:  Medicines Take over-the-counter and prescription medicines only as told by your health care provider. Ask before starting any new medicines, herbs, or supplements. General instructions Ask friends and family members to  support your choice to stay sober. Avoid places where alcohol is served. Create a plan to deal with tempting situations. Attend support groups regularly. Practice hobbies or activities you enjoy. Do not drink and drive. How is this prevented? Do not drink alcohol if your health care provider tells you not to drink. If you drink alcohol: Limit how much you have to: 0-1 drink a day for women who are not pregnant. 0-2 drinks a day for men. Know how much alcohol is in your drink. In the [place] one drink equals one 12 oz bottle of beer (355 mL), one 5 oz glass of wine (148 mL), or one 1 oz glass of hard liquor (44 mL). If you have a mental health condition, seek treatment. Develop a healthy lifestyle through: Meditation or deep breathing. Exercise. Spending time in nature. Listening to music. Talking with a trusted friend or family member. If you are a teen: Do not drink alcohol. Avoid gatherings where you might be tempted to drink alcohol. Do not be afraid to say no if someone offers you alcohol. Speak up about why you do not want to drink. Set a positive example for others around you by not drinking. Build relationships with friends who do not drink. Where to find more information Substance Abuse and Mental Health Services Administration: Sameday[employer].com.cy Alcoholics Anonymous: ShedSizes.ch Contact a health care provider if: You cannot take your medicines as told. Your symptoms get worse or you experience symptoms of withdrawal when you stop drinking. You start drinking again (relapse) and your symptoms get  worse. Get help right away if: You have thoughts about hurting yourself or others. Get help right away if you feel like you may hurt yourself or others, or have thoughts about taking your own life. Go to your nearest emergency room or: Call 911. Call the [place] at 806-131-6583 or 988. This is open 24 hours a day. Text the Crisis Text Line at  914-559-9632. Summary Alcohol use disorder is a condition in which drinking disrupts daily life. People with this condition drink too much alcohol and cannot control their drinking. Treatment may include detoxification, counseling, medicines, and support groups. Ask friends and family members to support you. Avoid situations where alcohol is served. Get help right away if you have thoughts about hurting yourself or others. This information is not intended to replace advice given to you by your health care provider. Make sure you discuss any questions you have with your health care provider. Document Revised: 11/29/2021 Document Reviewed: 11/29/2021 Elsevier Patient Education  [place].    _______________________________________________________  If your blood pressure at your visit was 140/90 or greater, please contact your primary care physician to follow up on this.  _______________________________________________________  If you are age 45 or older, your body mass index should be between 23-30. Your There is no height or weight on file to calculate BMI. If this is out of the aforementioned range listed, please consider follow up with your Primary Care Provider.  If you are age 75 or younger, your body mass index should be between 19-25. Your There is no height or weight on file to calculate BMI. If this is out of the aformentioned range listed, please consider follow up with your Primary Care Provider.   ________________________________________________________  The [facility] providers would like to encourage you to use [facility] to communicate with providers for non-urgent requests or questions.  Due to long hold times on the telephone, sending your provider a message by [facility] may be a faster and more efficient way to get a response.  Please allow 48 business hours for a response.  Please remember that this is for non-urgent requests.   _______________________________________________________ It was a pleasure to see you today!  Thank you for trusting me with your gastrointestinal care!

## 2022-12-27 ENCOUNTER — Other Ambulatory Visit: Payer: Medicaid Other

## 2022-12-27 DIAGNOSIS — Z8719 Personal history of other diseases of the digestive system: Secondary | ICD-10-CM

## 2022-12-27 DIAGNOSIS — R935 Abnormal findings on diagnostic imaging of other abdominal regions, including retroperitoneum: Secondary | ICD-10-CM

## 2022-12-27 DIAGNOSIS — F1010 Alcohol abuse, uncomplicated: Secondary | ICD-10-CM

## 2022-12-27 DIAGNOSIS — R194 Change in bowel habit: Secondary | ICD-10-CM

## 2022-12-27 LAB — LIPID PANEL
Cholesterol: 194 mg/dL (ref <200)
HDL: 87 mg/dL (ref >=50)
LDL Cholesterol (Calc): 90 mg/dL (calc)
Non-HDL Cholesterol (Calc): 107 mg/dL (calc) (ref <130)
Total CHOL/HDL Ratio: 2.2 (calc) (ref <5.0)
Triglycerides: 76 mg/dL (ref <150)

## 2022-12-27 LAB — IGG 4: IgG, Subclass 4: 77 mg/dL (ref 2–96)

## 2022-12-28 LAB — ANTI-NUCLEAR AB-TITER (ANA TITER): ANA Titer 1: 1:40 {titer} — ABNORMAL HIGH

## 2022-12-28 LAB — ANA: Anti Nuclear Antibody (ANA): POSITIVE — AB

## 2022-12-28 LAB — TISSUE TRANSGLUTAMINASE, IGA: (tTG) Ab, IgA: <1 U/mL

## 2022-12-28 LAB — IGA: Immunoglobulin A: 333 mg/dL — ABNORMAL HIGH (ref 47–310)

## 2022-12-31 ENCOUNTER — Other Ambulatory Visit: Payer: Self-pay

## 2022-12-31 DIAGNOSIS — R194 Change in bowel habit: Secondary | ICD-10-CM

## 2022-12-31 DIAGNOSIS — R935 Abnormal findings on diagnostic imaging of other abdominal regions, including retroperitoneum: Secondary | ICD-10-CM

## 2022-12-31 DIAGNOSIS — Z8719 Personal history of other diseases of the digestive system: Secondary | ICD-10-CM

## 2022-12-31 DIAGNOSIS — F1010 Alcohol abuse, uncomplicated: Secondary | ICD-10-CM

## 2022-12-31 MED ORDER — POTASSIUM CHLORIDE CRYS ER 20 MEQ PO TBCR: 20.0000 meq | EXTENDED_RELEASE_TABLET | Freq: Every day | ORAL | 0 refills | Status: DC

## 2022-12-31 NOTE — Addendum Note (Signed)
Addended byDebbe Mounts on: 12/31/2022 10:58 AM   Modules accepted: Orders

## 2023-01-02 LAB — PANCREATIC ELASTASE, FECAL: Pancreatic Elastase-1, Stool: >500 mcg/g

## 2023-01-02 LAB — FECAL FAT, QUALITATIVE
Fat Qual Neutral, Stl: NORMAL
Fat Qual Total, Stl: NORMAL

## 2023-01-08 ENCOUNTER — Telehealth: Payer: Self-pay | Admitting: Gastroenterology

## 2023-01-08 NOTE — Telephone Encounter (Signed)
I have called and spoken to the insurance for peer to peer.  Per the MD the initial request has not been reviewed as of yet. However, she did go ahead and do the initial review and peer to peer.  The CT has been approved from 3/27-4/26/2024 Auth number [id number]. Reference number [id number]

## 2023-01-08 NOTE — Telephone Encounter (Signed)
Patty,  Let's make sure they have all of the information sent and available for them. Thanks. GM

## 2023-01-08 NOTE — Telephone Encounter (Signed)
An authorization was started on 3/27 through [facility] but the case is still pending review. I was informed a CNA, RN or provider could call and perform a peer to peer review to expedite the approval process.  CB [address] [place] Provider Say Prior authorization Press 1 Press 8 Enter case# [id number]  Press 4 Press 1

## 2023-01-09 ENCOUNTER — Ambulatory Visit (HOSPITAL_COMMUNITY): Admission: RE | Admit: 2023-01-09 | Payer: Medicaid Other | Source: Ambulatory Visit

## 2023-01-29 ENCOUNTER — Ambulatory Visit (HOSPITAL_COMMUNITY)
Admission: RE | Admit: 2023-01-29 | Discharge: 2023-01-29 | Disposition: A | Discharge status: Home / Self Care | Payer: Medicaid Other | Source: Ambulatory Visit | Attending: Gastroenterology | Admitting: Gastroenterology

## 2023-01-29 DIAGNOSIS — F1010 Alcohol abuse, uncomplicated: Secondary | ICD-10-CM | POA: Insufficient documentation

## 2023-01-29 DIAGNOSIS — Z8719 Personal history of other diseases of the digestive system: Secondary | ICD-10-CM | POA: Diagnosis present

## 2023-01-29 DIAGNOSIS — R194 Change in bowel habit: Secondary | ICD-10-CM | POA: Insufficient documentation

## 2023-01-29 DIAGNOSIS — R935 Abnormal findings on diagnostic imaging of other abdominal regions, including retroperitoneum: Secondary | ICD-10-CM | POA: Insufficient documentation

## 2023-01-29 MED ORDER — IOHEXOL 300 MG/ML  SOLN
100.0000 mL | Freq: Once | INTRAMUSCULAR | Status: AC | PRN
  Administered 2023-01-29: 100 mL via INTRAVENOUS

## 2023-01-29 MED ORDER — IOHEXOL 9 MG/ML PO SOLN
ORAL | Status: AC
  Filled 2023-01-29: qty 1000

## 2023-02-05 ENCOUNTER — Other Ambulatory Visit: Payer: Self-pay

## 2023-02-05 DIAGNOSIS — R948 Abnormal results of function studies of other organs and systems: Secondary | ICD-10-CM

## 2023-02-05 DIAGNOSIS — Z8719 Personal history of other diseases of the digestive system: Secondary | ICD-10-CM

## 2023-02-05 MED ORDER — NA SULFATE-K SULFATE-MG SULF 17.5-3.13-1.6 GM/177ML PO SOLN: 1.0000 | Freq: Once | ORAL | 0 refills | Status: AC

## 2023-03-12 ENCOUNTER — Encounter (HOSPITAL_COMMUNITY): Payer: Self-pay | Admitting: Gastroenterology

## 2023-03-12 NOTE — Progress Notes (Signed)
Attempted to obtain medical history via telephone, unable to reach at this time. HIPAA compliant voicemail message left requesting return call to pre surgical testing department. 

## 2023-03-18 NOTE — Anesthesia Preprocedure Evaluation (Signed)
Anesthesia Evaluation  Patient identified by MRN, date of birth, ID band Patient awake    Reviewed: Allergy & Precautions, NPO status , Patient's Chart, lab work & pertinent test results  History of Anesthesia Complications Negative for: history of anesthetic complications  Airway Mallampati: III  TM Distance: >3 FB Neck ROM: Full    Dental  (+) Poor Dentition, Dental Advisory Given Several decayed and broken teeth including the top front teeth.:   Pulmonary neg shortness of breath, neg sleep apnea, neg COPD, neg recent URI, Current Smoker and Patient abstained from smoking.   Pulmonary exam normal breath sounds clear to auscultation       Cardiovascular (-) hypertension(-) angina (-) Past MI, (-) Cardiac Stents and (-) CABG negative cardio ROS (-) dysrhythmias  Rhythm:Regular Rate:Normal     Neuro/Psych neg Seizures negative neurological ROS     GI/Hepatic Bowel prep,neg GERD  ,,(+)     substance abuse  alcohol useAbnormal pancreas   Endo/Other  negative endocrine ROS    Renal/GU negative Renal ROS     Musculoskeletal   Abdominal   Peds  Hematology negative hematology ROS (+)   Anesthesia Other Findings   Reproductive/Obstetrics                              Anesthesia Physical Anesthesia Plan  ASA: 2  Anesthesia Plan: MAC   Post-op Pain Management:    Induction: Intravenous  PONV Risk Score and Plan: 1 and Propofol infusion and Treatment may vary due to age or medical condition  Airway Management Planned: Natural Airway and Nasal Cannula  Additional Equipment:   Intra-op Plan:   Post-operative Plan:   Informed Consent: I have reviewed the patients History and Physical, chart, labs and discussed the procedure including the risks, benefits and alternatives for the proposed anesthesia with the patient or authorized representative who has indicated his/her understanding and  acceptance.     Dental advisory given  Plan Discussed with: CRNA and Anesthesiologist  Anesthesia Plan Comments: (Discussed with patient risks of MAC including, but not limited to, minor pain or discomfort, hearing people in the room, and possible need for backup general anesthesia. Risks for general anesthesia also discussed including, but not limited to, sore throat, hoarse voice, chipped/damaged teeth, injury to vocal cords, nausea and vomiting, allergic reactions, lung infection, heart attack, stroke, and death. All questions answered. )        Anesthesia Quick Evaluation

## 2023-03-19 ENCOUNTER — Ambulatory Visit (HOSPITAL_COMMUNITY)
Admission: RE | Admit: 2023-03-19 | Discharge: 2023-03-19 | Disposition: A | Discharge status: Home / Self Care | Payer: Medicaid Other | Attending: Gastroenterology | Admitting: Gastroenterology

## 2023-03-19 ENCOUNTER — Encounter (HOSPITAL_COMMUNITY): Payer: Self-pay | Admitting: Gastroenterology

## 2023-03-19 ENCOUNTER — Other Ambulatory Visit: Payer: Self-pay

## 2023-03-19 ENCOUNTER — Encounter (HOSPITAL_COMMUNITY)
Admission: RE | Disposition: A | Discharge status: Home / Self Care | Payer: Self-pay | Source: Home / Self Care | Attending: Gastroenterology

## 2023-03-19 ENCOUNTER — Ambulatory Visit (HOSPITAL_BASED_OUTPATIENT_CLINIC_OR_DEPARTMENT_OTHER): Payer: Medicaid Other | Admitting: Anesthesiology

## 2023-03-19 ENCOUNTER — Ambulatory Visit (HOSPITAL_COMMUNITY): Payer: Medicaid Other | Admitting: Anesthesiology

## 2023-03-19 DIAGNOSIS — I899 Noninfective disorder of lymphatic vessels and lymph nodes, unspecified: Secondary | ICD-10-CM | POA: Insufficient documentation

## 2023-03-19 DIAGNOSIS — R933 Abnormal findings on diagnostic imaging of other parts of digestive tract: Secondary | ICD-10-CM | POA: Diagnosis present

## 2023-03-19 DIAGNOSIS — K209 Esophagitis, unspecified without bleeding: Secondary | ICD-10-CM | POA: Diagnosis not present

## 2023-03-19 DIAGNOSIS — Z1211 Encounter for screening for malignant neoplasm of colon: Secondary | ICD-10-CM

## 2023-03-19 DIAGNOSIS — K8689 Other specified diseases of pancreas: Secondary | ICD-10-CM | POA: Insufficient documentation

## 2023-03-19 DIAGNOSIS — K641 Second degree hemorrhoids: Secondary | ICD-10-CM

## 2023-03-19 DIAGNOSIS — K859 Acute pancreatitis without necrosis or infection, unspecified: Secondary | ICD-10-CM | POA: Diagnosis not present

## 2023-03-19 DIAGNOSIS — D122 Benign neoplasm of ascending colon: Secondary | ICD-10-CM | POA: Diagnosis not present

## 2023-03-19 DIAGNOSIS — R948 Abnormal results of function studies of other organs and systems: Secondary | ICD-10-CM

## 2023-03-19 DIAGNOSIS — D132 Benign neoplasm of duodenum: Secondary | ICD-10-CM | POA: Insufficient documentation

## 2023-03-19 DIAGNOSIS — K3189 Other diseases of stomach and duodenum: Secondary | ICD-10-CM | POA: Insufficient documentation

## 2023-03-19 DIAGNOSIS — F17210 Nicotine dependence, cigarettes, uncomplicated: Secondary | ICD-10-CM | POA: Insufficient documentation

## 2023-03-19 DIAGNOSIS — K2289 Other specified disease of esophagus: Secondary | ICD-10-CM | POA: Insufficient documentation

## 2023-03-19 DIAGNOSIS — K449 Diaphragmatic hernia without obstruction or gangrene: Secondary | ICD-10-CM

## 2023-03-19 DIAGNOSIS — Z8719 Personal history of other diseases of the digestive system: Secondary | ICD-10-CM

## 2023-03-19 HISTORY — PX: COLONOSCOPY WITH PROPOFOL: SHX5780

## 2023-03-19 HISTORY — PX: EUS: SHX5427

## 2023-03-19 HISTORY — PX: POLYPECTOMY: SHX5525

## 2023-03-19 HISTORY — PX: BIOPSY: SHX5522

## 2023-03-19 HISTORY — PX: ESOPHAGOGASTRODUODENOSCOPY (EGD) WITH PROPOFOL: SHX5813

## 2023-03-19 SURGERY — UPPER ENDOSCOPIC ULTRASOUND (EUS) RADIAL
Anesthesia: Monitor Anesthesia Care

## 2023-03-19 MED ORDER — SODIUM CHLORIDE 0.9 % IV SOLN: INTRAVENOUS | Status: DC

## 2023-03-19 MED ORDER — ESOMEPRAZOLE MAGNESIUM 40 MG PO CPDR: 40.0000 mg | DELAYED_RELEASE_CAPSULE | Freq: Two times a day (BID) | ORAL | 12 refills | Status: DC

## 2023-03-19 MED ORDER — MIDAZOLAM HCL 2 MG/2ML IJ SOLN
INTRAMUSCULAR | Status: AC
  Filled 2023-03-19: qty 2

## 2023-03-19 MED ORDER — MIDAZOLAM HCL 2 MG/2ML IJ SOLN
INTRAMUSCULAR | Status: DC | PRN
  Administered 2023-03-19: 2 mg via INTRAVENOUS

## 2023-03-19 MED ORDER — PHENYLEPHRINE HCL (PRESSORS) 10 MG/ML IV SOLN
INTRAVENOUS | Status: DC | PRN
  Administered 2023-03-19: 80 ug via INTRAVENOUS
  Administered 2023-03-19: 160 ug via INTRAVENOUS

## 2023-03-19 MED ORDER — DICYCLOMINE HCL 20 MG PO TABS: 20.0000 mg | ORAL_TABLET | Freq: Three times a day (TID) | ORAL | 1 refills | Status: DC | PRN

## 2023-03-19 MED ORDER — PROPOFOL 500 MG/50ML IV EMUL
INTRAVENOUS | Status: DC | PRN
  Administered 2023-03-19: 150 ug/kg/min via INTRAVENOUS

## 2023-03-19 MED ORDER — DEXMEDETOMIDINE HCL IN NACL 200 MCG/50ML IV SOLN
INTRAVENOUS | Status: DC | PRN
  Administered 2023-03-19: 12 ug via INTRAVENOUS
  Administered 2023-03-19: 8 ug via INTRAVENOUS

## 2023-03-19 MED ORDER — LACTATED RINGERS IV SOLN
INTRAVENOUS | Status: DC
  Administered 2023-03-19: 1000 mL via INTRAVENOUS

## 2023-03-19 SURGICAL SUPPLY — 22 items

## 2023-03-19 NOTE — Op Note (Addendum)
[facility] Patient Name: Sydney Dalton Procedure Date: 03/19/2023 MRN: [id number] Attending MD: Corliss Parish , MD, [id number] Date of Birth: [birth year] CSN: [id number] Age: 47 Admit Type: Outpatient Procedure:                Upper EUS Indications:              Abnormal abdominal/pelvic CT scan, Exclusion of                            chronic pancreatitis, Acute pancreatitis,                            Epigastric abdominal pain Providers:                Corliss Parish, MD, Fransisca Connors, Priscella Mann, Technician Referring MD:              Medicines:                Monitored Anesthesia Care Complications:            No immediate complications. Estimated Blood Loss:     Estimated blood loss was minimal. Procedure:                Pre-Anesthesia Assessment:                           - Prior to the procedure, a History and Physical                            was performed, and patient medications and                            allergies were reviewed. The patient's tolerance of                            previous anesthesia was also reviewed. The risks                            and benefits of the procedure and the sedation                            options and risks were discussed with the patient.                            All questions were answered, and informed consent                            was obtained. Prior Anticoagulants: The patient has                            taken no anticoagulant or antiplatelet agents. ASA                            Grade Assessment: II - A  patient with mild systemic                            disease. After reviewing the risks and benefits,                            the patient was deemed in satisfactory condition to                            undergo the procedure.                           After obtaining informed consent, the endoscope was                            passed under direct  vision. Throughout the                            procedure, the patient's blood pressure, pulse, and                            oxygen saturations were monitored continuously. The                            [id number]-H190 ([id number] Olympus endoscope was introduced                            through the mouth, and advanced to the second part                            of duodenum. The [id number]-[id number] ([id number]) Olympus                            duodenoscope was introduced through the mouth, and                            advanced to the area of papilla. The [id number]                            ([id number]) Olympus linear ultrasound scope was                            introduced through the mouth, and advanced to the                            duodenum for ultrasound examination from the                            esophagus, stomach and duodenum. The upper EUS was                            accomplished without difficulty. The patient  tolerated the procedure. Scope In: Scope Out: Findings:      ENDOSCOPIC FINDING: :      No gross lesions were noted in the proximal esophagus and in the mid       esophagus.      LA Grade B (one or more mucosal breaks greater than 5 mm, not extending       between the tops of two mucosal folds) esophagitis with no bleeding was       found in the distal esophagus.      The Z-line was irregular and was found 40 cm from the incisors.      A 1 cm hiatal hernia was present.      Patchy mildly erythematous mucosa without bleeding was found in the       entire examined stomach. Biopsies were taken with a cold forceps for       histology and Helicobacter pylori testing.      A single 8 mm submucosal nodule was found in the second portion of the       duodenum (on contralateral wall of major papilla). Biopsies were taken       with a cold forceps for histology (placed as proximal duodenal nodule       jar).      A single 9 mm submucosal nodule was  found in the second portion of the       duodenum (on contralateral wall of major papilla). Biopsies were taken       with a cold forceps for histology (placed as distal duodenal nodule jar).      Patchy mildly erythematous mucosa without active bleeding was found in       the duodenal bulb, in the first portion of the duodenum and in the       second portion of the duodenum.      ENDOSONOGRAPHIC FINDING: :      Pancreatic parenchymal abnormalities were noted in the entire pancreas.       These consisted of hyperechoic foci with shadowing, lobularity without       honeycombing and hyperechoic strands.      The pancreatic duct had an intraductal stone in the pancreatic duct       within the head, had a prominently branched endosonographic appearance       within the neck, had a tortuous/ectatic appearance (throughout) and had       hyperechoic walls (throughout) in the pancreatic head (4.0 mm), genu of       the pancreas (2.4 mm), body of the pancreas (2.6 mm) and tail of the       pancreas (1.5 mm).      There was no sign of significant endosonographic abnormality in the       common bile duct (2.6 mm -> 4.8 mm) and in the common hepatic duct (5.0       mm). Ducts of normal caliber/contour were identified.      Endosonographic imaging of the ampulla showed no intramural       (subepithelial) lesion.      An oval intramural (subepithelial) lesion was found in the second       portion of the duodenum. The lesion was hypoechoic. Endosonographically,       the lesion appeared to originate from within the deep mucosa (Layer 2).       The lesion measured 9 mm (in maximum thickness). The lesion also  measured 6 mm in diameter. The outer margins were well defined. This       corresponds to the noted nodule above (distal duodenal nodule).      Endosonographic imaging in the visualized portion of the liver showed no       mass.      No malignant-appearing lymph nodes were visualized in the  celiac region       (level 20), peripancreatic region and porta hepatis region.      The celiac region was visualized. Impression:               EGD impression:                           - No gross lesions in the proximal esophagus and in                            the mid esophagus.                           - LA Grade B esophagitis with no bleeding found                            distally.                           - Z-line irregular, 40 cm from the incisors.                           - 1 cm hiatal hernia.                           - Erythematous mucosa in the stomach. Biopsied.                           - Submucosal nodule found in the duodenum                            (contralateral to major papilla). Biopsied.                           - Submucosal nodule found in the duodenum                            (contralateral to major papilla). Biopsied.                           - Erythematous duodenopathy otherwise in visualized                            duodenum.                           EUS impression:                           - Pancreatic parenchymal abnormalities consisting  of hyperechoic foci, lobularity and hyperechoic                            strands were noted in the entire pancreas.                           - The pancreatic duct had intraductal stones, had a                            prominently branched endosonographic appearance,                            had a tortuous/ectatic appearance and had                            hyperechoic walls in the pancreatic head, genu of                            the pancreas, body of the pancreas and tail of the                            pancreas.                           -Based on the findings at EUS, this patient has                            Rosemont criteria consistent with chronic                            pancreatitis.                           - There was no sign of significant pathology in  the                            common bile duct and in the common hepatic duct.                           - An intramural (subepithelial) lesion was found in                            the second portion of the duodenum. The lesion                            appeared to originate from within the deep mucosa                            (Layer 2). This corresponds with the second                            submucosal nodule in the duodenum noted above.                           -  No malignant-appearing lymph nodes were                            visualized in the celiac region (level 20),                            peripancreatic region and porta hepatis region. Moderate Sedation:      Not Applicable - Patient had care per Anesthesia. Recommendation:           - Proceed to scheduled colonoscopy.                           - Observe patient's clinical course.                           - Initiate omeprazole 40 mg twice daily for 2                            months then once daily.                           - Will initiate patient on Bentyl 20 mg 3 times                            daily as needed for the next few weeks to see if                            that is helpful in regards to her cramping                            abdominal discomfort. If it is then she can                            continue it and we will send refills otherwise she                            can stop it.                           - Repeat EGD in 3 to 4 months to ensure healing of                            esophagitis and rule out any underlying Barrett's                            esophagus.                           - Await path results.                           - Before I entertain pancreatic duct stone  extraction or stenting via ERCP, patient will have                            to show significant abstention from alcohol or                            significant decrease in overall  consumption.                           -Not clear that we need to continue to monitor the                            duodenal nodule unless pathology returns concerning.                           - The findings and recommendations were discussed                            with the patient.                           - The findings and recommendations were discussed                            with the patient's family. Procedure Code(s):        --- Professional ---                           [phone], Esophagogastroduodenoscopy, flexible,                            transoral; with endoscopic ultrasound examination                            limited to the esophagus, stomach or duodenum, and                            adjacent structures                           43239, Esophagogastroduodenoscopy, flexible,                            transoral; with biopsy, single or multiple Diagnosis Code(s):        --- Professional ---                           K20.90, Esophagitis, unspecified without bleeding                           K22.89, Other specified disease of esophagus                           K44.9, Diaphragmatic hernia without obstruction or  gangrene                           K31.89, Other diseases of stomach and duodenum                           K86.9, Disease of pancreas, unspecified                           K86.89, Other specified diseases of pancreas                           R93.3, Abnormal findings on diagnostic imaging of                            other parts of digestive tract                           I89.9, Noninfective disorder of lymphatic vessels                            and lymph nodes, unspecified                           K85.90, Acute pancreatitis without necrosis or                            infection, unspecified                           R10.13, Epigastric pain                           R93.5, Abnormal findings on diagnostic imaging of                             other abdominal regions, including retroperitoneum CPT copyright 2022 American [employer] Association. All rights reserved. The codes documented in this report are preliminary and upon coder review may  be revised to meet current compliance requirements. Corliss Parish, MD 03/19/2023 10:56:44 AM Number of Addenda: 0

## 2023-03-19 NOTE — H&P (Signed)
GASTROENTEROLOGY PROCEDURE H&P NOTE   Primary Care Physician: Patient, No Pcp Per  HPI: Sydney Dalton is a 47 y.o. female who presents for EGD/EUS + Colonoscopy to evaluate for chronic pancreatitis and for Colon Cancer screening.  History reviewed. No pertinent past medical history. History reviewed. No pertinent surgical history. No current facility-administered medications for this encounter.   No current facility-administered medications for this encounter. No Known Allergies Family History  Problem Relation Age of Onset   Dementia Mother    Colon cancer Neg Hx    Pancreatic cancer Neg Hx    Liver cancer Neg Hx    Esophageal cancer Neg Hx    Stomach cancer Neg Hx    Rectal cancer Neg Hx    Social History   Socioeconomic History   Marital status: Single    Spouse name: Not on file   Number of children: 1   Years of education: Not on file   Highest education level: Not on file  Occupational History   Occupation: Works at [employer]  Tobacco Use   Smoking status: Some Days    Types: Cigarettes   Smokeless tobacco: Not on file  Vaping Use   Vaping Use: Never used  Substance and Sexual Activity   Alcohol use: Yes    Comment: occ   Drug use: Never   Sexual activity: Not on file  Other Topics Concern   Not on file  Social History Narrative   Not on file   Social Determinants of Health   Financial Resource Strain: Not on file  Food Insecurity: No Food Insecurity (10/29/2022)   Hunger Vital Sign    Worried About Running Out of Food in the Last Year: Never true    Ran Out of Food in the Last Year: Never true  Transportation Needs: No Transportation Needs (10/29/2022)   PRAPARE - Administrator, Civil Service (Medical): No    Lack of Transportation (Non-Medical): No  Physical Activity: Not on file  Stress: Not on file  Social Connections: Not on file  Intimate Partner Violence: Not At Risk (10/29/2022)   Humiliation, Afraid, Rape, and Kick  questionnaire    Fear of Current or Ex-Partner: No    Emotionally Abused: No    Physically Abused: No    Sexually Abused: No    Physical Exam: Today's Vitals   03/18/23 1155  Weight: 56.7 kg   Body mass index is 20.48 kg/m. GEN: NAD EYE: Sclerae anicteric ENT: MMM CV: Non-tachycardic GI: Soft, NT/ND NEURO:  Alert & Oriented x 3  Lab Results: No results for input(s): "WBC", "HGB", "HCT", "PLT" in the last 72 hours. BMET No results for input(s): "NA", "K", "CL", "CO2", "GLUCOSE", "BUN", "CREATININE", "CALCIUM" in the last 72 hours. LFT No results for input(s): "PROT", "ALBUMIN", "AST", "ALT", "ALKPHOS", "BILITOT", "BILIDIR", "IBILI" in the last 72 hours. PT/INR No results for input(s): "LABPROT", "INR" in the last 72 hours.   Impression / Plan: This is a 47 y.o.female who presents for EGD/EUS + Colonoscopy to evaluate for chronic pancreatitis and for Colon Cancer screening.  The risks of an EUS including intestinal perforation, bleeding, infection, aspiration, and medication effects were discussed as was the possibility it may not give a definitive diagnosis if a biopsy is performed.  When a biopsy of the pancreas is done as part of the EUS, there is an additional risk of pancreatitis at the rate of about 1-2%.  It was explained that procedure related pancreatitis is  typically mild, although it can be severe and even life threatening, which is why we do not perform random pancreatic biopsies and only biopsy a lesion/area we feel is concerning enough to warrant the risk.   The risks and benefits of endoscopic evaluation/treatment were discussed with the patient and/or family; these include but are not limited to the risk of perforation, infection, bleeding, missed lesions, lack of diagnosis, severe illness requiring hospitalization, as well as anesthesia and sedation related illnesses.  The patient's history has been reviewed, patient examined, no change in status, and deemed stable  for procedure.  The patient and/or family is agreeable to proceed.    Corliss Parish, MD [facility] Office # [phone]

## 2023-03-19 NOTE — Anesthesia Postprocedure Evaluation (Signed)
Anesthesia Post Note  Patient: Sydney Dalton  Procedure(s) Performed: UPPER ENDOSCOPIC ULTRASOUND (EUS) RADIAL COLONOSCOPY WITH PROPOFOL ESOPHAGOGASTRODUODENOSCOPY (EGD) WITH PROPOFOL BIOPSY POLYPECTOMY     Patient location during evaluation: PACU Anesthesia Type: MAC Level of consciousness: awake Pain management: pain level controlled Vital Signs Assessment: post-procedure vital signs reviewed and stable Respiratory status: spontaneous breathing, nonlabored ventilation and respiratory function stable Cardiovascular status: stable and blood pressure returned to baseline Postop Assessment: no apparent nausea or vomiting Anesthetic complications: no   No notable events documented.  Last Vitals:  Vitals:   03/19/23 1054 03/19/23 1100  BP: (!) 94/56 (!) 99/56  Pulse: 84 83  Resp: (!) 25 (!) 22  Temp:    SpO2: 92% 90%    Last Pain:  Vitals:   03/19/23 1100  TempSrc:   PainSc: 0-No pain                 Linton Rump

## 2023-03-19 NOTE — Transfer of Care (Signed)
Immediate Anesthesia Transfer of Care Note  Patient: Sydney Dalton  Procedure(s) Performed: UPPER ENDOSCOPIC ULTRASOUND (EUS) RADIAL COLONOSCOPY WITH PROPOFOL ESOPHAGOGASTRODUODENOSCOPY (EGD) WITH PROPOFOL BIOPSY POLYPECTOMY  Patient Location: PACU  Anesthesia Type:MAC  Level of Consciousness: awake, alert , and oriented  Airway & Oxygen Therapy: Patient Spontanous Breathing and Patient connected to face mask oxygen  Post-op Assessment: Report given to RN and Post -op Vital signs reviewed and stable  Post vital signs: Reviewed and stable  Last Vitals:  Vitals Value Taken Time  BP 99/56 03/19/23 1100  Temp 36.5 C 03/19/23 1043  Pulse 79 03/19/23 1103  Resp 22 03/19/23 1103  SpO2 92 % 03/19/23 1103  Vitals shown include unvalidated device data.  Last Pain:  Vitals:   03/19/23 1100  TempSrc:   PainSc: 0-No pain         Complications: No notable events documented.

## 2023-03-19 NOTE — Op Note (Signed)
[facility] Patient Name: Sydney Dalton Procedure Date: 03/19/2023 MRN: [id number] Attending MD: Corliss Parish , MD, [id number] Date of Birth: [birth year] CSN: [id number] Age: 47 Admit Type: Outpatient Procedure:                Colonoscopy Indications:              Screening for colorectal malignant neoplasm, This                            is the patient's first colonoscopy Providers:                Corliss Parish, MD, Fransisca Connors, Priscella Mann, Technician Referring MD:              Medicines:                Monitored Anesthesia Care Complications:            No immediate complications. Estimated Blood Loss:     Estimated blood loss was minimal. Procedure:                Pre-Anesthesia Assessment:                           - Prior to the procedure, a History and Physical                            was performed, and patient medications and                            allergies were reviewed. The patient's tolerance of                            previous anesthesia was also reviewed. The risks                            and benefits of the procedure and the sedation                            options and risks were discussed with the patient.                            All questions were answered, and informed consent                            was obtained. Prior Anticoagulants: The patient has                            taken no anticoagulant or antiplatelet agents. ASA                            Grade Assessment: II - A patient with mild systemic  disease. After reviewing the risks and benefits,                            the patient was deemed in satisfactory condition to                            undergo the procedure.                           After obtaining informed consent, the colonoscope                            was passed under direct vision. Throughout the                             procedure, the patient's blood pressure, pulse, and                            oxygen saturations were monitored continuously. The                            [id number]-[id number] ([id number]) Olympus colonoscope was                            introduced through the anus and advanced to the 3                            cm into the ileum. The colonoscopy was performed                            without difficulty. The patient tolerated the                            procedure. The quality of the bowel preparation was                            adequate. The terminal ileum, ileocecal valve,                            appendiceal orifice, and rectum were photographed. Scope In: 10:21:08 AM Scope Out: 10:30:23 AM Scope Withdrawal Time: 0 hours 7 minutes 14 seconds  Total Procedure Duration: 0 hours 9 minutes 15 seconds  Findings:      The digital rectal exam findings include hemorrhoids. Pertinent       negatives include no palpable rectal lesions.      The terminal ileum and ileocecal valve appeared normal.      A 4 mm polyp was found in the ascending colon. The polyp was sessile.       The polyp was removed with a cold snare. Resection and retrieval were       complete.      Normal mucosa was found in the entire colon otherwise.      Non-bleeding non-thrombosed internal hemorrhoids were found during       retroflexion, during perianal exam and during digital exam. The  hemorrhoids were Grade II (internal hemorrhoids that prolapse but reduce       spontaneously). Impression:               - Hemorrhoids found on digital rectal exam.                           - The examined portion of the ileum was normal.                           - One 4 mm polyp in the ascending colon, removed                            with a cold snare. Resected and retrieved.                           - Normal mucosa in the entire examined colon                            otherwise.                           - Non-bleeding  non-thrombosed internal hemorrhoids. Moderate Sedation:      Not Applicable - Patient had care per Anesthesia. Recommendation:           - The patient will be observed post-procedure,                            until all discharge criteria are met.                           - Discharge patient to home.                           - Patient has a contact number available for                            emergencies. The signs and symptoms of potential                            delayed complications were discussed with the                            patient. Return to normal activities tomorrow.                            Written discharge instructions were provided to the                            patient.                           - High fiber diet.                           - Use FiberCon 1-2 tablets PO daily.                           -  Continue present medications.                           - Await pathology results.                           - Repeat colonoscopy in 5-10 years for surveillance                            based on pathology results.                           - The findings and recommendations were discussed                            with the patient.                           - The findings and recommendations were discussed                            with the patient's family. Procedure Code(s):        --- Professional ---                           [phone], Colonoscopy, flexible; with removal of                            tumor(s), polyp(s), or other lesion(s) by snare                            technique Diagnosis Code(s):        --- Professional ---                           Z12.11, Encounter for screening for malignant                            neoplasm of colon                           K64.1, Second degree hemorrhoids                           D12.2, Benign neoplasm of ascending colon CPT copyright 2022 American Medical Association. All rights reserved. The codes  documented in this report are preliminary and upon coder review may  be revised to meet current compliance requirements. Corliss Parish, MD 03/19/2023 10:43:48 AM Number of Addenda: 0

## 2023-03-19 NOTE — Discharge Instructions (Signed)
YOU HAD AN ENDOSCOPIC PROCEDURE TODAY: Refer to the procedure report and other information in the discharge instructions given to you for any specific questions about what was found during the examination. If this information does not answer your questions, please call [place] office at [phone] to clarify.  ° °YOU SHOULD EXPECT: Some feelings of bloating in the abdomen. Passage of more gas than usual. Walking can help get rid of the air that was put into your GI tract during the procedure and reduce the bloating. If you had a lower endoscopy (such as a colonoscopy or flexible sigmoidoscopy) you may notice spotting of blood in your stool or on the toilet paper. Some abdominal soreness may be present for a day or two, also. ° °DIET: Your first meal following the procedure should be a light meal and then it is ok to progress to your normal diet. A half-sandwich or bowl of soup is an example of a good first meal. Heavy or fried foods are harder to digest and may make you feel nauseous or bloated. Drink plenty of fluids but you should avoid alcoholic beverages for 24 hours. If you had a esophageal dilation, please see attached instructions for diet.   ° °ACTIVITY: Your care partner should take you home directly after the procedure. You should plan to take it easy, moving slowly for the rest of the day. You can resume normal activity the day after the procedure however YOU SHOULD NOT DRIVE, use power tools, machinery or perform tasks that involve climbing or major physical exertion for 24 hours (because of the sedation medicines used during the test).  ° °SYMPTOMS TO REPORT IMMEDIATELY: °A gastroenterologist can be reached at any hour. Please call [phone]  for any of the following symptoms:  °Following lower endoscopy (colonoscopy, flexible sigmoidoscopy) °Excessive amounts of blood in the stool  °Significant tenderness, worsening of abdominal pains  °Swelling of the abdomen that is new, acute  °Fever of 100° or  higher  °Following upper endoscopy (EGD, EUS, ERCP, esophageal dilation) °Vomiting of blood or coffee ground material  °New, significant abdominal pain  °New, significant chest pain or pain under the shoulder blades  °Painful or persistently difficult swallowing  °New shortness of breath  °Black, tarry-looking or red, bloody stools ° °FOLLOW UP:  °If any biopsies were taken you will be contacted by phone or by letter within the next 1-3 weeks. Call [phone]  if you have not heard about the biopsies in 3 weeks.  °Please also call with any specific questions about appointments or follow up tests. ° °

## 2023-03-20 LAB — SURGICAL PATHOLOGY

## 2023-03-21 ENCOUNTER — Encounter: Payer: Self-pay | Admitting: Gastroenterology

## 2023-03-24 ENCOUNTER — Encounter (HOSPITAL_COMMUNITY): Payer: Self-pay | Admitting: Gastroenterology

## 2023-04-04 ENCOUNTER — Encounter: Payer: Self-pay | Admitting: Dermatology

## 2023-04-04 ENCOUNTER — Ambulatory Visit (INDEPENDENT_AMBULATORY_CARE_PROVIDER_SITE_OTHER): Payer: Medicaid Other | Admitting: Dermatology

## 2023-04-04 VITALS — BP 160/106 | HR 62

## 2023-04-04 DIAGNOSIS — D485 Neoplasm of uncertain behavior of skin: Secondary | ICD-10-CM

## 2023-04-04 DIAGNOSIS — L723 Sebaceous cyst: Secondary | ICD-10-CM

## 2023-04-04 DIAGNOSIS — L720 Epidermal cyst: Secondary | ICD-10-CM

## 2023-04-04 MED ORDER — TRIAMCINOLONE ACETONIDE 10 MG/ML IJ SUSP
10.0000 mg | Freq: Once | INTRAMUSCULAR | Status: DC
Start: 2023-04-04 — End: 2023-12-27

## 2023-04-04 MED ORDER — DOXYCYCLINE HYCLATE 100 MG PO CAPS
100.0000 mg | ORAL_CAPSULE | Freq: Every day | ORAL | 0 refills | Status: AC
Start: 2023-04-04 — End: 2023-04-14

## 2023-04-04 MED ORDER — MUPIROCIN 2 % EX OINT
1.0000 | TOPICAL_OINTMENT | Freq: Every day | CUTANEOUS | 0 refills | Status: DC
Start: 2023-04-04 — End: 2023-12-25

## 2023-04-04 NOTE — Progress Notes (Signed)
New Patient Visit   Subjective  Sydney Dalton is a 47 y.o. female who presents for the following: Multiple Cysts  Patient states she has cysts located at the face, chin and Genital area that she would like to have examined. Patient reports the areas have been there for  about 15  year(s). She reports the areas are bothersome.She reports periodically, the areas are itchy and she will place ice pack to alleviate the itching. She states that the areas have spread. The first cyst appeared on her right cheek and has now gotten smaller. Patient reports has not previously been treated for these areas. Patient denies Hx of bx. Patient denies family history of skin cancer(s).    The following portions of the chart were reviewed this encounter and updated as appropriate: medications, allergies, medical history  Review of Systems:  No other skin or systemic complaints except as noted in HPI or Assessment and Plan.  Objective  Well appearing patient in no apparent distress; mood and affect are within normal limits.  A focused examination was performed of the following areas: Right Cheek, Chin, Labia  Relevant exam findings are noted in the Assessment and Plan.  Mid Chin, Right Labia Majora (2) INFLAMED CYST   Right Labium Majus Inflamed Cyst         Exam:  1.5 cm Non-tender Subcutaneous nodule at Chin,  Non-paplpable cyst Right Cheek and 2 mm mobile Non-tender subcutaneous cyst at Right Labia Majora  Assessment & Plan   EPIDERMAL INCLUSION CYSTs  1. Cyst on right cheek - History of a cyst on the right cheek, which has now resolved and is non-palpable. No intervention needed at this time.  2. Cyst on chin - 1.5 cm non-tender mobile subcutaneous cyst on the chin. - Treatment: Incision and drainage performed with an 11 blade. Kenalog injection administered to shrink the cyst and prevent regrowth. Prescribed oral Doxycycline 100 mg twice a day for 10 days to control inflammation and  shrink the cyst. Patient advised to apply antibiotic ointment and a band-aid on the area and let it heal. Surgical removal of the cyst can be considered in the future if the patient desires.  3. Cyst on right labia majora - 2 cm non-tender mobile subcutaneous cyst on the right labia majora. - Treatment: Incision and drainage performed. Sac removed and sent for histopathological examination to rule out a cyst. Prescribed Mupirocin ointment to be applied twice a day after cleaning with gentle soap and water. Oral Doxycycline 100 mg twice a day for 10 days to control inflammation and shrink the cyst. Patient advised to use gauze and underwear to manage drainage and bleeding.  Follow-up as needed. Ensure prescriptions are sent to the correct [facility] location.  Sebaceous cyst  Related Medications doxycycline (VIBRAMYCIN) 100 MG capsule Take 1 capsule (100 mg total) by mouth daily for 10 days. TAKE AFTER YOU EAT CARBS and ENSURE  mupirocin ointment (BACTROBAN) 2 % Apply 1 Application topically daily.  Epidermal inclusion cyst (2) Mid Chin, Right Labia Majora  Incision and Drainage - Mid Chin, Right Labia Majora Location: Chin, Right Labia Majora  Informed Consent: Discussed risks (permanent scarring, light or dark discoloration, infection, pain, bleeding, bruising, redness, damage to adjacent structures, and recurrence of the lesion) and benefits of the procedure, as well as the alternatives.  Informed consent was obtained.  Preparation: The area was prepped with alcohol.  Procedure Note Intralesional Injection  Location: Chin, Right Labia Majora  Informed Consent: Discussed risks (  infection, pain, bleeding, bruising, thinning of the skin, loss of skin pigment, lack of resolution, and recurrence of lesion) and benefits of the procedure, as well as the alternatives. Informed consent was obtained. Preparation: The area was prepared a standard fashion.  Anesthesia:Lidocaine with Sodium  Bicarb buffer  Procedure Details: An intralesional injection was performed with Kenalog 10 mg/cc. Marland Kitchen01 cc in total were injected. NDC #: 5409-8119-14 Exp: 10/07/2024  Total number of injections: 2  Plan: The patient was instructed on post-op care. Recommend OTC analgesia as needed for pain.   Anesthesia: Lidocaine 1% with epinephrine with Sodium Bicarb buffer  Procedure Details: An incision was made overlying the lesion. The lesion drained pus and cyst material. The area was flushed with sterile saline. Ointment and a sterile pressure dressing were applied. The patient tolerated procedure well.  Total number of lesions drained: 2  Plan: The patient was instructed on post-op care. Recommend OTC analgesia as needed for pain.   triamcinolone acetonide (KENALOG) 10 MG/ML injection 10 mg - Mid Chin, Right Labia Majora   Neoplasm of uncertain behavior of skin Right Labium Majus  Skin / nail biopsy Type of biopsy: incisional   Informed consent: discussed and consent obtained   Timeout: patient name, date of birth, surgical site, and procedure verified   Instrument used comment:  11 Blade Outcome: patient tolerated procedure well   Post-procedure details: wound care instructions given     Return if symptoms worsen or fail to improve, for Cyst.  Documentation: I have reviewed the above documentation for accuracy and completeness, and I agree with the above.  Stasia Cavalier, am acting as scribe for Langston Reusing, DO.  Langston Reusing, DO

## 2023-04-04 NOTE — Patient Instructions (Addendum)
Thank you for visiting my office today. I appreciate your commitment to addressing your health concerns and taking the necessary steps towards improvement.  Here is a summary of the key instructions from today's appointment:  - Medications Prescribed:   - Doxycycline 100 mg: Take orally twice a day for 10 days with food to prevent stomach upset. This antibiotic will help reduce inflammation and prevent infection.   - Mupirocin ointment: Apply twice a day to the affected areas after cleaning with gentle soap and water.  - Procedures Performed:   - Incision and Drainage:     - Chin: A small incision was made to drain the cyst; Kenalog was injected to prevent recurrence.     - Right Labia Majora: The cyst was drained, and the sac was removed. The specimen has been sent for analysis.  - Post-Procedure Care:   - Apply a small amount of antibiotic ointment and a band-aid on the chin area. Change the band-aid daily and avoid disturbing the area to allow proper healing.   - For the labia area, maintain cleanliness and apply Mupirocin as directed. Use gauze to protect the area and manage any minor bleeding.  - Follow-Up:   - Monitor the treated areas for signs of infection or unusual symptoms.   - Consider surgical removal of the cysts if recurrent issues occur. Our surgeon is available in September for further consultation if you choose to proceed.   Due to recent changes in healthcare laws, you may see results of your pathology and/or laboratory studies on MyChart before the doctors have had a chance to review them. We understand that in some cases there may be results that are confusing or concerning to you. Please understand that not all results are received at the same time and often the doctors may need to interpret multiple results in order to provide you with the best plan of care or course of treatment. Therefore, we ask that you please give [place] 2 business days to thoroughly review all your  results before contacting the office for clarification. Should we see a critical lab result, you will be contacted sooner.   If You Need Anything After Your Visit  If you have any questions or concerns for your doctor, please call our main line at [phone] If no one answers, please leave a voicemail as directed and we will return your call as soon as possible. Messages left after 4 pm will be answered the following business day.   You may also send [place] a message via MyChart. We typically respond to MyChart messages within 1-2 business days.  For prescription refills, please ask your pharmacy to contact our office. Our fax number is [phone].  If you have an urgent issue when the clinic is closed that cannot wait until the next business day, you can page your doctor at the number below.    Please note that while we do our best to be available for urgent issues outside of office hours, we are not available 24/7.   If you have an urgent issue and are unable to reach [place], you may choose to seek medical care at your doctor's office, retail clinic, urgent care center, or emergency room.  If you have a medical emergency, please immediately call 911 or go to the emergency department. In the event of inclement weather, please call our main line at [phone] for an update on the status of any delays or closures.  Dermatology Medication Tips: Please keep the  boxes that topical medications come in in order to help keep track of the instructions about where and how to use these. Pharmacies typically print the medication instructions only on the boxes and not directly on the medication tubes.   If your medication is too expensive, please contact our office at [phone] or send [place] a message through MyChart.   We are unable to tell what your co-pay for medications will be in advance as this is different depending on your insurance coverage. However, we may be able to find a substitute medication  at lower cost or fill out paperwork to get insurance to cover a needed medication.   If a prior authorization is required to get your medication covered by your insurance company, please allow [place] 1-2 business days to complete this process.  Drug prices often vary depending on where the prescription is filled and some pharmacies may offer cheaper prices.  The website www.goodrx.com contains coupons for medications through different pharmacies. The prices here do not account for what the cost may be with help from insurance (it may be cheaper with your insurance), but the website can give you the price if you did not use any insurance.  - You can print the associated coupon and take it with your prescription to the pharmacy.  - You may also stop by our office during regular business hours and pick up a GoodRx coupon card.  - If you need your prescription sent electronically to a different pharmacy, notify our office through [facility] or by phone at [phone]

## 2023-04-30 NOTE — Progress Notes (Signed)
Hi Sydney Dalton  Dr. Onalee Hua reviewed your biopsy results and it showed the spot removed was benign (not cancerous).  No additional treatment is required.  The detailed report is available to view in MyChart.  Have a great day!  Kind Regards,  Dr. Kermit Balo Care Team

## 2023-12-24 ENCOUNTER — Encounter (HOSPITAL_COMMUNITY): Payer: Self-pay

## 2023-12-24 ENCOUNTER — Inpatient Hospital Stay (HOSPITAL_COMMUNITY)
Admission: EM | Admit: 2023-12-24 | Discharge: 2023-12-27 | DRG: 440 | Disposition: A | Discharge status: Home / Self Care | Attending: Internal Medicine | Admitting: Internal Medicine

## 2023-12-24 ENCOUNTER — Other Ambulatory Visit: Payer: Self-pay

## 2023-12-24 DIAGNOSIS — F1090 Alcohol use, unspecified, uncomplicated: Secondary | ICD-10-CM | POA: Diagnosis present

## 2023-12-24 DIAGNOSIS — F17210 Nicotine dependence, cigarettes, uncomplicated: Secondary | ICD-10-CM | POA: Diagnosis present

## 2023-12-24 DIAGNOSIS — Z789 Other specified health status: Secondary | ICD-10-CM | POA: Diagnosis present

## 2023-12-24 DIAGNOSIS — K852 Alcohol induced acute pancreatitis without necrosis or infection: Principal | ICD-10-CM | POA: Diagnosis present

## 2023-12-24 DIAGNOSIS — E876 Hypokalemia: Secondary | ICD-10-CM | POA: Diagnosis present

## 2023-12-24 DIAGNOSIS — F1020 Alcohol dependence, uncomplicated: Secondary | ICD-10-CM | POA: Diagnosis present

## 2023-12-24 DIAGNOSIS — Z860100 Personal history of colon polyps, unspecified: Secondary | ICD-10-CM

## 2023-12-24 DIAGNOSIS — K859 Acute pancreatitis without necrosis or infection, unspecified: Principal | ICD-10-CM | POA: Diagnosis present

## 2023-12-24 DIAGNOSIS — K860 Alcohol-induced chronic pancreatitis: Secondary | ICD-10-CM | POA: Diagnosis present

## 2023-12-24 DIAGNOSIS — N200 Calculus of kidney: Secondary | ICD-10-CM | POA: Diagnosis present

## 2023-12-24 DIAGNOSIS — D132 Benign neoplasm of duodenum: Secondary | ICD-10-CM | POA: Diagnosis present

## 2023-12-24 DIAGNOSIS — D649 Anemia, unspecified: Secondary | ICD-10-CM | POA: Insufficient documentation

## 2023-12-24 DIAGNOSIS — K449 Diaphragmatic hernia without obstruction or gangrene: Secondary | ICD-10-CM | POA: Diagnosis present

## 2023-12-24 DIAGNOSIS — K760 Fatty (change of) liver, not elsewhere classified: Secondary | ICD-10-CM | POA: Diagnosis present

## 2023-12-24 DIAGNOSIS — R030 Elevated blood-pressure reading, without diagnosis of hypertension: Secondary | ICD-10-CM | POA: Insufficient documentation

## 2023-12-24 HISTORY — DX: Acute pancreatitis without necrosis or infection, unspecified: K85.90

## 2023-12-24 LAB — RESP PANEL BY RT-PCR (RSV, FLU A&B, COVID)  RVPGX2
Influenza A by PCR: NEGATIVE
Influenza B by PCR: NEGATIVE
Resp Syncytial Virus by PCR: NEGATIVE
SARS Coronavirus 2 by RT PCR: NEGATIVE

## 2023-12-24 LAB — COMPREHENSIVE METABOLIC PANEL
ALT: 25 U/L (ref 0–44)
AST: 54 U/L — ABNORMAL HIGH (ref 15–41)
Albumin: 3.9 g/dL (ref 3.5–5.0)
Alkaline Phosphatase: 80 U/L (ref 38–126)
Anion gap: 13 (ref 5–15)
BUN: 9 mg/dL (ref 6–20)
CO2: 27 mmol/L (ref 22–32)
Calcium: 9.2 mg/dL (ref 8.9–10.3)
Chloride: 101 mmol/L (ref 98–111)
Creatinine, Ser: 0.59 mg/dL (ref 0.44–1.00)
GFR, Estimated: >60 mL/min (ref >60)
Glucose, Bld: 109 mg/dL — ABNORMAL HIGH (ref 70–99)
Potassium: 3 mmol/L — ABNORMAL LOW (ref 3.5–5.1)
Sodium: 141 mmol/L (ref 135–145)
Total Bilirubin: 0.8 mg/dL (ref 0.0–1.2)
Total Protein: 7.9 g/dL (ref 6.5–8.1)

## 2023-12-24 LAB — CBC WITH DIFFERENTIAL/PLATELET
Abs Immature Granulocytes: 0.03 10*3/uL (ref 0.00–0.07)
Basophils Absolute: 0 10*3/uL (ref 0.0–0.1)
Basophils Relative: 0 %
Eosinophils Absolute: 0 10*3/uL (ref 0.0–0.5)
Eosinophils Relative: 0 %
HCT: 34.4 % — ABNORMAL LOW (ref 36.0–46.0)
Hemoglobin: 11.7 g/dL — ABNORMAL LOW (ref 12.0–15.0)
Immature Granulocytes: 0 %
Lymphocytes Relative: 14 %
Lymphs Abs: 1.1 10*3/uL (ref 0.7–4.0)
MCH: 31.2 pg (ref 26.0–34.0)
MCHC: 34 g/dL (ref 30.0–36.0)
MCV: 91.7 fL (ref 80.0–100.0)
Monocytes Absolute: 0.6 10*3/uL (ref 0.1–1.0)
Monocytes Relative: 7 %
Neutro Abs: 6.3 10*3/uL (ref 1.7–7.7)
Neutrophils Relative %: 79 %
Platelets: 188 10*3/uL (ref 150–400)
RBC: 3.75 MIL/uL — ABNORMAL LOW (ref 3.87–5.11)
RDW: 17 % — ABNORMAL HIGH (ref 11.5–15.5)
WBC: 8 10*3/uL (ref 4.0–10.5)
nRBC: 0 % (ref 0.0–0.2)

## 2023-12-24 LAB — URINALYSIS, ROUTINE W REFLEX MICROSCOPIC
Bilirubin Urine: NEGATIVE
Glucose, UA: NEGATIVE mg/dL
Hgb urine dipstick: NEGATIVE
Ketones, ur: 20 mg/dL — AB
Leukocytes,Ua: NEGATIVE
Nitrite: NEGATIVE
Protein, ur: 100 mg/dL — AB
Specific Gravity, Urine: 1.026 (ref 1.005–1.030)
pH: 7 (ref 5.0–8.0)

## 2023-12-24 LAB — LIPASE, BLOOD: Lipase: 815 U/L — ABNORMAL HIGH (ref 11–51)

## 2023-12-24 NOTE — ED Triage Notes (Signed)
 Pt c/o mid to lower back pain and vomiting started this morning. Pt states she hasn't urinated all day, because she can't keep anything down

## 2023-12-24 NOTE — ED Provider Triage Note (Signed)
 Emergency Medicine Provider Triage Evaluation Note  Sydney Dalton , a 48 y.o. female  was evaluated in triage.  Pt complains of abdominal pain, nausea, vomiting.  Review of Systems  Positive: Fever, chills, congestion, cough, back pain from vomiting Negative: Hematemesis, numbness, weakness, urinary symptoms, diarrhea  Physical Exam  BP 137/65 (BP Location: Right Arm)   Pulse 73   Temp 98.1 F (36.7 C)   Resp 16   Ht 5' 5.5" (1.664 m)   Wt 56.7 kg   SpO2 98%   BMI 20.48 kg/m  Gen:   Awake, no distress   Resp:  Normal effort  MSK:   Moves extremities without difficulty  Other:    Medical Decision Making  Medically screening exam initiated at 4:19 PM.  Appropriate orders placed.  YOLANDA HUFFSTETLER was informed that the remainder of the evaluation will be completed by another provider, this initial triage assessment does not replace that evaluation, and the importance of remaining in the ED until their evaluation is complete.  Labs ordered   Dolphus Jenny, PA-C 12/24/23 1622

## 2023-12-25 ENCOUNTER — Emergency Department (HOSPITAL_COMMUNITY)

## 2023-12-25 ENCOUNTER — Encounter (HOSPITAL_COMMUNITY): Payer: Self-pay | Admitting: Internal Medicine

## 2023-12-25 DIAGNOSIS — D649 Anemia, unspecified: Secondary | ICD-10-CM

## 2023-12-25 DIAGNOSIS — R030 Elevated blood-pressure reading, without diagnosis of hypertension: Secondary | ICD-10-CM | POA: Insufficient documentation

## 2023-12-25 DIAGNOSIS — Z860100 Personal history of colon polyps, unspecified: Secondary | ICD-10-CM | POA: Diagnosis not present

## 2023-12-25 DIAGNOSIS — D132 Benign neoplasm of duodenum: Secondary | ICD-10-CM | POA: Diagnosis present

## 2023-12-25 DIAGNOSIS — F1090 Alcohol use, unspecified, uncomplicated: Secondary | ICD-10-CM | POA: Diagnosis not present

## 2023-12-25 DIAGNOSIS — K852 Alcohol induced acute pancreatitis without necrosis or infection: Secondary | ICD-10-CM | POA: Diagnosis present

## 2023-12-25 DIAGNOSIS — E876 Hypokalemia: Secondary | ICD-10-CM

## 2023-12-25 DIAGNOSIS — K860 Alcohol-induced chronic pancreatitis: Secondary | ICD-10-CM | POA: Diagnosis present

## 2023-12-25 DIAGNOSIS — K859 Acute pancreatitis without necrosis or infection, unspecified: Secondary | ICD-10-CM | POA: Diagnosis present

## 2023-12-25 DIAGNOSIS — R109 Unspecified abdominal pain: Secondary | ICD-10-CM | POA: Diagnosis present

## 2023-12-25 DIAGNOSIS — F17210 Nicotine dependence, cigarettes, uncomplicated: Secondary | ICD-10-CM | POA: Diagnosis present

## 2023-12-25 DIAGNOSIS — K760 Fatty (change of) liver, not elsewhere classified: Secondary | ICD-10-CM | POA: Diagnosis present

## 2023-12-25 DIAGNOSIS — N200 Calculus of kidney: Secondary | ICD-10-CM | POA: Diagnosis present

## 2023-12-25 DIAGNOSIS — R197 Diarrhea, unspecified: Secondary | ICD-10-CM | POA: Diagnosis not present

## 2023-12-25 DIAGNOSIS — K449 Diaphragmatic hernia without obstruction or gangrene: Secondary | ICD-10-CM | POA: Diagnosis present

## 2023-12-25 DIAGNOSIS — F1020 Alcohol dependence, uncomplicated: Secondary | ICD-10-CM | POA: Diagnosis present

## 2023-12-25 LAB — IRON AND TIBC
Iron: 117 ug/dL (ref 28–170)
Saturation Ratios: 27 % (ref 10.4–31.8)
TIBC: 428 ug/dL (ref 250–450)
UIBC: 311 ug/dL

## 2023-12-25 LAB — CBC WITH DIFFERENTIAL/PLATELET
Abs Immature Granulocytes: 0.09 10*3/uL — ABNORMAL HIGH (ref 0.00–0.07)
Basophils Absolute: 0 10*3/uL (ref 0.0–0.1)
Basophils Relative: 0 %
Eosinophils Absolute: 0 10*3/uL (ref 0.0–0.5)
Eosinophils Relative: 0 %
HCT: 31.4 % — ABNORMAL LOW (ref 36.0–46.0)
Hemoglobin: 10.6 g/dL — ABNORMAL LOW (ref 12.0–15.0)
Immature Granulocytes: 1 %
Lymphocytes Relative: 22 %
Lymphs Abs: 1.7 10*3/uL (ref 0.7–4.0)
MCH: 30.6 pg (ref 26.0–34.0)
MCHC: 33.8 g/dL (ref 30.0–36.0)
MCV: 90.8 fL (ref 80.0–100.0)
Monocytes Absolute: 0.7 10*3/uL (ref 0.1–1.0)
Monocytes Relative: 9 %
Neutro Abs: 5.4 10*3/uL (ref 1.7–7.7)
Neutrophils Relative %: 68 %
Platelets: 161 10*3/uL (ref 150–400)
RBC: 3.46 MIL/uL — ABNORMAL LOW (ref 3.87–5.11)
RDW: 16.9 % — ABNORMAL HIGH (ref 11.5–15.5)
WBC: 8 10*3/uL (ref 4.0–10.5)
nRBC: 0 % (ref 0.0–0.2)

## 2023-12-25 LAB — RAPID URINE DRUG SCREEN, HOSP PERFORMED
Amphetamines: NOT DETECTED
Barbiturates: NOT DETECTED
Benzodiazepines: NOT DETECTED
Cocaine: NOT DETECTED
Opiates: NOT DETECTED
Tetrahydrocannabinol: POSITIVE — AB

## 2023-12-25 LAB — VITAMIN B12: Vitamin B-12: 328 pg/mL (ref 180–914)

## 2023-12-25 LAB — HEPATIC FUNCTION PANEL
ALT: 21 U/L (ref 0–44)
AST: 34 U/L (ref 15–41)
Albumin: 3.3 g/dL — ABNORMAL LOW (ref 3.5–5.0)
Alkaline Phosphatase: 65 U/L (ref 38–126)
Bilirubin, Direct: 0.1 mg/dL (ref 0.0–0.2)
Indirect Bilirubin: 0.8 mg/dL (ref 0.3–0.9)
Total Bilirubin: 0.9 mg/dL (ref 0.0–1.2)
Total Protein: 6.8 g/dL (ref 6.5–8.1)

## 2023-12-25 LAB — BASIC METABOLIC PANEL
Anion gap: 8 (ref 5–15)
BUN: <5 mg/dL — ABNORMAL LOW (ref 6–20)
CO2: 30 mmol/L (ref 22–32)
Calcium: 8.5 mg/dL — ABNORMAL LOW (ref 8.9–10.3)
Chloride: 98 mmol/L (ref 98–111)
Creatinine, Ser: 0.61 mg/dL (ref 0.44–1.00)
GFR, Estimated: >60 mL/min (ref >60)
Glucose, Bld: 95 mg/dL (ref 70–99)
Potassium: 3 mmol/L — ABNORMAL LOW (ref 3.5–5.1)
Sodium: 136 mmol/L (ref 135–145)

## 2023-12-25 LAB — RETICULOCYTES
Immature Retic Fract: 10.1 % (ref 2.3–15.9)
RBC.: 3.46 MIL/uL — ABNORMAL LOW (ref 3.87–5.11)
Retic Count, Absolute: 29.1 10*3/uL (ref 19.0–186.0)
Retic Ct Pct: 0.8 % (ref 0.4–3.1)

## 2023-12-25 LAB — MAGNESIUM: Magnesium: 1.5 mg/dL — ABNORMAL LOW (ref 1.7–2.4)

## 2023-12-25 LAB — PREGNANCY, URINE: Preg Test, Ur: NEGATIVE

## 2023-12-25 LAB — HIV ANTIBODY (ROUTINE TESTING W REFLEX): HIV Screen 4th Generation wRfx: NONREACTIVE

## 2023-12-25 LAB — FERRITIN: Ferritin: 23 ng/mL (ref 11–307)

## 2023-12-25 LAB — TRIGLYCERIDES: Triglycerides: 48 mg/dL (ref <150)

## 2023-12-25 LAB — FOLATE: Folate: 7 ng/mL (ref >5.9)

## 2023-12-25 MED ORDER — LACTATED RINGERS IV SOLN: INTRAVENOUS | Status: AC

## 2023-12-25 MED ORDER — LORAZEPAM 2 MG/ML IJ SOLN: 1.0000 mg | INTRAMUSCULAR | Status: DC | PRN

## 2023-12-25 MED ORDER — HYDROMORPHONE HCL 1 MG/ML IJ SOLN
0.5000 mg | INTRAMUSCULAR | Status: DC | PRN
  Administered 2023-12-25: 0.5 mg via INTRAVENOUS
  Filled 2023-12-25: qty 0.5

## 2023-12-25 MED ORDER — FOLIC ACID 1 MG PO TABS
1.0000 mg | ORAL_TABLET | Freq: Every day | ORAL | Status: DC
  Administered 2023-12-25 – 2023-12-27 (×3): 1 mg via ORAL
  Filled 2023-12-25 (×3): qty 1

## 2023-12-25 MED ORDER — POTASSIUM CHLORIDE 10 MEQ/100ML IV SOLN
10.0000 meq | INTRAVENOUS | Status: AC
  Administered 2023-12-25 (×3): 10 meq via INTRAVENOUS
  Filled 2023-12-25 (×3): qty 100

## 2023-12-25 MED ORDER — LACTATED RINGERS IV BOLUS
1000.0000 mL | Freq: Once | INTRAVENOUS | Status: AC
  Administered 2023-12-25: 1000 mL via INTRAVENOUS

## 2023-12-25 MED ORDER — THIAMINE MONONITRATE 100 MG PO TABS
100.0000 mg | ORAL_TABLET | Freq: Every day | ORAL | Status: DC
  Administered 2023-12-26 – 2023-12-27 (×2): 100 mg via ORAL
  Filled 2023-12-25 (×2): qty 1

## 2023-12-25 MED ORDER — LORAZEPAM 1 MG PO TABS: 1.0000 mg | ORAL_TABLET | ORAL | Status: DC | PRN

## 2023-12-25 MED ORDER — THIAMINE HCL 100 MG/ML IJ SOLN
100.0000 mg | Freq: Every day | INTRAMUSCULAR | Status: DC
  Administered 2023-12-25: 100 mg via INTRAVENOUS
  Filled 2023-12-25: qty 2

## 2023-12-25 MED ORDER — IOHEXOL 350 MG/ML SOLN
75.0000 mL | Freq: Once | INTRAVENOUS | Status: AC | PRN
  Administered 2023-12-25: 75 mL via INTRAVENOUS

## 2023-12-25 MED ORDER — LACTATED RINGERS IV BOLUS
1000.0000 mL | Freq: Once | INTRAVENOUS | Status: AC
Start: 2023-12-25 — End: 2023-12-25
  Administered 2023-12-25: 1000 mL via INTRAVENOUS

## 2023-12-25 MED ORDER — POTASSIUM CHLORIDE CRYS ER 20 MEQ PO TBCR
40.0000 meq | EXTENDED_RELEASE_TABLET | Freq: Once | ORAL | Status: AC
  Administered 2023-12-25: 40 meq via ORAL
  Filled 2023-12-25: qty 2

## 2023-12-25 MED ORDER — MAGNESIUM SULFATE 2 GM/50ML IV SOLN
2.0000 g | Freq: Once | INTRAVENOUS | Status: AC
  Administered 2023-12-25: 2 g via INTRAVENOUS
  Filled 2023-12-25: qty 50

## 2023-12-25 MED ORDER — ADULT MULTIVITAMIN W/MINERALS CH
1.0000 | ORAL_TABLET | Freq: Every day | ORAL | Status: DC
  Administered 2023-12-26 – 2023-12-27 (×2): 1 via ORAL
  Filled 2023-12-25 (×3): qty 1

## 2023-12-25 MED ORDER — ONDANSETRON HCL 4 MG/2ML IJ SOLN
4.0000 mg | Freq: Once | INTRAMUSCULAR | Status: AC
  Administered 2023-12-25: 4 mg via INTRAVENOUS
  Filled 2023-12-25: qty 2

## 2023-12-25 MED ORDER — HEPARIN SODIUM (PORCINE) 5000 UNIT/ML IJ SOLN
5000.0000 [IU] | Freq: Three times a day (TID) | INTRAMUSCULAR | Status: DC
  Filled 2023-12-25: qty 1

## 2023-12-25 MED ORDER — HYDROMORPHONE HCL 1 MG/ML IJ SOLN
1.0000 mg | Freq: Once | INTRAMUSCULAR | Status: AC
  Administered 2023-12-25: 1 mg via INTRAVENOUS
  Filled 2023-12-25: qty 1

## 2023-12-25 NOTE — H&P (Signed)
 History and Physical    Sydney Dalton:[id number] DOB: [birth year] DOA: 12/24/2023  Patient coming from: Home.  Chief Complaint: Abdominal pain.  HPI: Sydney Dalton is a 48 y.o. female with history of pancreatitis presents to the ER with complaints of severe abdominal pain with multiple episodes of vomiting since yesterday morning.  Patient states she was having alcohol for last 3 days celebrating an occasion with family.  Pain is mostly epigastric and across the abdomen sometimes radiates to back.  Denies any blood in the vomitus.  Patient did have an EUS by Dr. Meridee Score in June 2024 which showed some pancreatic ductal stones and features of chronic pancreatitis.  Patient also underwent EGD and colonoscopy at that time.  ED Course: In the ER CT scan shows acute on chronic severe pancreatitis.  Labs show lipase of 815 potassium 3 BUN 9 creatinine 0.5 hemoglobin 9.7 platelets 180.  Patient was started on fluids and admitted for further management of acute pancreatitis.  Review of Systems: As per HPI, rest all negative.   Past Medical History:  Diagnosis Date   Pancreatitis     Past Surgical History:  Procedure Laterality Date   BIOPSY  03/19/2023   Procedure: BIOPSY;  Surgeon: Meridee Score Netty Starring., MD;  Location: [facility];  Service: Gastroenterology;;   COLONOSCOPY WITH PROPOFOL N/A 03/19/2023   Procedure: COLONOSCOPY WITH PROPOFOL;  Surgeon: Lemar Lofty., MD;  Location: [facility];  Service: Gastroenterology;  Laterality: N/A;   ESOPHAGOGASTRODUODENOSCOPY (EGD) WITH PROPOFOL N/A 03/19/2023   Procedure: ESOPHAGOGASTRODUODENOSCOPY (EGD) WITH PROPOFOL;  Surgeon: Meridee Score Netty Starring., MD;  Location: [facility];  Service: Gastroenterology;  Laterality: N/A;   EUS N/A 03/19/2023   Procedure: UPPER ENDOSCOPIC ULTRASOUND (EUS) RADIAL;  Surgeon: Lemar Lofty., MD;  Location: [facility];  Service: Gastroenterology;  Laterality: N/A;   POLYPECTOMY   03/19/2023   Procedure: POLYPECTOMY;  Surgeon: Mansouraty, Netty Starring., MD;  Location: [facility];  Service: Gastroenterology;;     reports that she has been smoking cigarettes. She has never been exposed to tobacco smoke. She does not have any smokeless tobacco history on file. She reports current alcohol use. She reports that she does not use drugs.  No Known Allergies  Family History  Problem Relation Age of Onset   Dementia Mother    Colon cancer Neg Hx    Pancreatic cancer Neg Hx    Liver cancer Neg Hx    Esophageal cancer Neg Hx    Stomach cancer Neg Hx    Rectal cancer Neg Hx     Prior to Admission medications   Not on File    Physical Exam: Constitutional: Moderately built and nourished. Vitals:   12/24/23 1617 12/24/23 2121 12/25/23 0300 12/25/23 0330  BP:  (!) 157/85 (!) 153/101 (!) 140/85  Pulse:  60 (!) 57 82  Resp:  18  18  Temp:  98 F (36.7 C)  98.5 F (36.9 C)  TempSrc:    Oral  SpO2:  96% 100% 98%  Weight: 56.7 kg     Height: 5' 5.5" (1.664 m)      Eyes: Anicteric no pallor. ENMT: No discharge from the ears eyes nose or mouth. Neck: No mass felt.  No neck rigidity. Respiratory: No rhonchi or crepitations. Cardiovascular: S1-S2 heard. Abdomen: Soft nontender bowel sound present. Musculoskeletal: No edema. Skin: No rash. Neurologic: Alert awake oriented time place and person.  Moves all extremities. Psychiatric: Appears normal.  Normal affect.   Labs on Admission: I  have personally reviewed following labs and imaging studies  CBC: Recent Labs  Lab 12/24/23 1621  WBC 8.0  NEUTROABS 6.3  HGB 11.7*  HCT 34.4*  MCV 91.7  PLT 188   Basic Metabolic Panel: Recent Labs  Lab 12/24/23 1621  NA 141  K 3.0*  CL 101  CO2 27  GLUCOSE 109*  BUN 9  CREATININE 0.59  CALCIUM 9.2   GFR: Estimated Creatinine Clearance: 79.5 mL/min (by C-G formula based on SCr of 0.59 mg/dL). Liver Function Tests: Recent Labs  Lab 12/24/23 1621  AST 54*   ALT 25  ALKPHOS 80  BILITOT 0.8  PROT 7.9  ALBUMIN 3.9   Recent Labs  Lab 12/24/23 1621  LIPASE 815*   No results for input(s): "AMMONIA" in the last 168 hours. Coagulation Profile: No results for input(s): "INR", "PROTIME" in the last 168 hours. Cardiac Enzymes: No results for input(s): "CKTOTAL", "CKMB", "CKMBINDEX", "TROPONINI" in the last 168 hours. BNP (last 3 results) No results for input(s): "PROBNP" in the last 8760 hours. HbA1C: No results for input(s): "HGBA1C" in the last 72 hours. CBG: No results for input(s): "GLUCAP" in the last 168 hours. Lipid Profile: No results for input(s): "CHOL", "HDL", "LDLCALC", "TRIG", "CHOLHDL", "LDLDIRECT" in the last 72 hours. Thyroid Function Tests: No results for input(s): "TSH", "T4TOTAL", "FREET4", "T3FREE", "THYROIDAB" in the last 72 hours. Anemia Panel: No results for input(s): "VITAMINB12", "FOLATE", "FERRITIN", "TIBC", "IRON", "RETICCTPCT" in the last 72 hours. Urine analysis:    Component Value Date/Time   COLORURINE YELLOW 12/24/2023 1621   APPEARANCEUR HAZY (A) 12/24/2023 1621   LABSPEC 1.026 12/24/2023 1621   PHURINE 7.0 12/24/2023 1621   GLUCOSEU NEGATIVE 12/24/2023 1621   HGBUR NEGATIVE 12/24/2023 1621   BILIRUBINUR NEGATIVE 12/24/2023 1621   KETONESUR 20 (A) 12/24/2023 1621   PROTEINUR 100 (A) 12/24/2023 1621   UROBILINOGEN 0.2 06/09/2014 1611   NITRITE NEGATIVE 12/24/2023 1621   LEUKOCYTESUR NEGATIVE 12/24/2023 1621   Sepsis Labs: @LABRCNTIP (procalcitonin:4,lacticidven:4) ) Recent Results (from the past 240 hours)  Resp panel by RT-PCR (RSV, Flu A&B, Covid) Anterior Nasal Swab     Status: None   Collection Time: 12/24/23  4:21 PM   Specimen: Anterior Nasal Swab  Result Value Ref Range Status   SARS Coronavirus 2 by RT PCR NEGATIVE NEGATIVE Final   Influenza A by PCR NEGATIVE NEGATIVE Final   Influenza B by PCR NEGATIVE NEGATIVE Final    Comment: (NOTE) The Xpert Xpress SARS-CoV-2/FLU/RSV plus assay  is intended as an aid in the diagnosis of influenza from Nasopharyngeal swab specimens and should not be used as a sole basis for treatment. Nasal washings and aspirates are unacceptable for Xpert Xpress SARS-CoV-2/FLU/RSV testing.  Fact Sheet for Patients: BloggerCourse.com  Fact Sheet for Healthcare Providers: SeriousBroker.it  This test is not yet approved or cleared by the [place] FDA and has been authorized for detection and/or diagnosis of SARS-CoV-2 by FDA under an Emergency Use Authorization (EUA). This EUA will remain in effect (meaning this test can be used) for the duration of the COVID-19 declaration under Section 564(b)(1) of the Act, 21 U.S.C. section 360bbb-3(b)(1), unless the authorization is terminated or revoked.     Resp Syncytial Virus by PCR NEGATIVE NEGATIVE Final    Comment: (NOTE) Fact Sheet for Patients: BloggerCourse.com  Fact Sheet for Healthcare Providers: SeriousBroker.it  This test is not yet approved or cleared by the [place] FDA and has been authorized for detection and/or diagnosis of SARS-CoV-2 by FDA under  an Emergency Use Authorization (EUA). This EUA will remain in effect (meaning this test can be used) for the duration of the COVID-19 declaration under Section 564(b)(1) of the Act, 21 U.S.C. section 360bbb-3(b)(1), unless the authorization is terminated or revoked.  Performed at [facility], [address], [place], [place] [address]      Radiological Exams on Admission: CT ABDOMEN PELVIS W CONTRAST Result Date: 12/25/2023 CLINICAL DATA:  Mid to lower back pain and vomiting. EXAM: CT ABDOMEN AND PELVIS WITH CONTRAST TECHNIQUE: Multidetector CT imaging of the abdomen and pelvis was performed using the standard protocol following bolus administration of intravenous contrast. RADIATION DOSE REDUCTION: This exam was  performed according to the departmental dose-optimization program which includes automated exposure control, adjustment of the mA and/or kV according to patient size and/or use of iterative reconstruction technique. CONTRAST:  75mL OMNIPAQUE IOHEXOL 350 MG/ML SOLN COMPARISON:  January 29, 2023 FINDINGS: Lower chest: No acute abnormality. Hepatobiliary: There is diffuse fatty infiltration of the liver parenchyma. No focal liver abnormality is seen. No gallstones, gallbladder wall thickening, or biliary dilatation. Pancreas: Moderate to marked severity peripancreatic inflammatory fat stranding is seen. Innumerable subcentimeter parenchymal calcifications are noted within the head of the pancreas. There is no evidence of pancreatic ductal dilatation Spleen: Normal in size without focal abnormality. Adrenals/Urinary Tract: Adrenal glands are unremarkable. Kidneys are normal in size, without obstructing renal calculi, focal lesion, or hydronephrosis. A 2 mm nonobstructing renal calculus is seen within the right kidney. Bladder is unremarkable. Stomach/Bowel: Stomach is within normal limits. Appendix appears normal. No evidence of bowel dilatation. Mild proximal Peri duodenal inflammatory fat stranding is seen which likely extends from the inflammatory process involving the adjacent pancreatic head. Vascular/Lymphatic: Mild aortic atherosclerosis. No enlarged abdominal or pelvic lymph nodes. Reproductive: Ill-defined uterine fibroids are suspected within the uterine fundus. The bilateral adnexa are unremarkable. Other: No abdominal wall hernia or abnormality. No abdominopelvic ascites. Musculoskeletal: No acute or significant osseous findings. IMPRESSION: 1. Moderate to marked severity acute on chronic pancreatitis. 2. Hepatic steatosis. 3. 2 mm nonobstructing right renal calculus. 4. Aortic atherosclerosis. Aortic Atherosclerosis (ICD10-I70.0). Electronically Signed   By: Aram Candela M.D.   On: 12/25/2023 03:51     Assessment/Plan Principal Problem:   Acute pancreatitis Active Problems:   Alcohol use    Acute on chronic pancreatitis -     cause not clear.  Could be related to recent alcohol binge but patient did have an EUS in June of last year which did show some pancreatic ductal stones.  Check triglyceride levels.  Will consult GI.  Continue with IV fluids pain relief medications.  N.p.o. for now. Anemia normocytic normochromic appears to be chronic.  Will check anemia panel. Elevated blood pressure reading follow blood pressure trends. Alcohol use closely monitor for any withdrawal.  Pregnancy screen is pending.   Since patient has acute pancreatitis will need aggressive hydration and further management and more than 2 midnight stay.   DVT prophylaxis: Heparin. Code Status: Full code. Family Communication: Discussed with patient. Disposition Plan: Medical floor. Consults called: Gastroenterologist. Admission status: Observation.

## 2023-12-25 NOTE — Progress Notes (Signed)
 Brief same day note:  Patient is a 48 year old female with history of chronic alcoholism, recurrent pancreatitis who presented with severe abdomen pain, vomiting.  Pain was mostly in the epigastric region sometimes radiating to the back.  On presentation, labs showed lipase of 815, potassium of 3.  Patient was admitted for the management of acute  pancreatitis.  Started on IV fluids, currently n.p.o.  Seen and examined at bedside today.  She feels more comfortable during my evaluation.  Her abdomen is still tender but soft and nondistended.  She wants to try clear liquid diet today.  No nausea or vomiting  Assessment and plan:   Acute on chronic pancreatitis: Likely due to binge alcohol drinking.  Drink alcohol last weekend while at a celebration.  Continue n.p.o. status, IV fluid.  Started on clear liquid diet. CT abdomen/pelvis showed moderate to marked severe acute on chronic pancreatitis, hepatic steatosis, renal calculus. Also has history of duodenal adenoma, GI will set up EGD as an outpatient  Normocytic anemia: Currently hemoglobin stable  Hypokalemia/hypomagnesemia: Supplemented with potassium, magnesium  Elevated blood pressure: Currently blood pressure stable  Chronic alcohol use: Continue to monitor for withdrawal.  Continue thiamine folic acid.On ciwa

## 2023-12-25 NOTE — Discharge Instructions (Signed)

## 2023-12-25 NOTE — Consult Note (Signed)
 Consultation Note   Referring Provider:  [facility] PCP: Sydney Dalton, No Pcp Per Primary Gastroenterologist:: Corliss Parish, MD         Reason for Consultation:  Pancreatitis DOA: 12/24/2023         Hospital Day: 2   ASSESSMENT    48 y.o. year old female with a history of chronic pancreatitis . Has had prior workup ( no gallstones, negative autoimmune marker, normal triglycerides, etc ) but cause wasn't clear. Felt Etoh related vrs idiopathic. Admitted now with acute on chronic pancreatitis.  Acute flare in pancreatitis possibly Etoh related  Hypokalemia  Chronic [place] anemia.  Hgb stable in 10-11 range. No overt GI bleeding. Has had EGD and colonoscopy   History of colon polyps Subcentimeter TA in June 2024  History of duodenal adenoma with low grade dysplasia.  Plan was for repeat EGD to remove lesion but appears not to have been done?   Hiatal hernia / history of reflux esophagitis Asymptomatic, not on any reflux medication.   Hepatic steatosis  Renal stones  See PMH for additional history  PLAN:   --Supportive care with anti-emetics, analgesics, IVF --Will probably start clear tomorrow --Vitamin B1 --CIWA protocol in place --Electrolyte repletion in progress  HPI   Sydney Dalton had pancreatitis in Jan 2024, Etoh related vrs idiopathic. Follow up imaging in April showed chronic calcific pancreatitis. A punctate calcification was seen on imaging raising concern for chronic pancreatitis. Also had fatty infiltration changes of the colon that may be seen with prior inflammatory bowel disease. EUS and colonoscopy recommended. EUS done in June and showed findings of chronic pancreatitis.  An intramural (subepithelial) lesion was found in the second portion of the duodenum. Biopsies c/w duodenal adenoma with low grade dysplasia. Colonoscopy done and a small TA was removed. Normal mucosa in entire colon. Plan was for follow up  EGD to remove adenoma but ? Not ever done  Sydney Dalton presented to ED yesterday with acute back pain, generalized abdominal pain and vomiting. Unable to eat since symptoms started on Saturday. In ED, Lipase in 800's. AST minimally elevated. Tbili and ALK Phos normal. CT AP showed acute on chronic severe pancreatitis.  She takes no medications at home.  She consumes Etoh 3 days a weeks. Has about 3 shots of alcohol on Thursday, Friday and Saturdays.   Weight fluctuates 117 - 122 pounds.   Bowel movements general normal, stools formed. Pancreatic elastase and qual fecal fat were normal in [facility] 2024  Labs and Imaging:  Recent Labs    12/24/23 1621 12/25/23 0629  WBC 8.0 8.0  HGB 11.7* 10.6*  HCT 34.4* 31.4*  MCV 91.7 90.8  PLT 188 161   Recent Labs    12/25/23 0629  FOLATE 7.0  VITAMINB12 328  FERRITIN 23  TIBC 428  IRONPCTSAT 27   Recent Labs    12/24/23 1621 12/25/23 0629  NA 141 136  K 3.0* 3.0*  CL 101 98  CO2 27 30  GLUCOSE 109* 95  BUN 9 <5*  CREATININE 0.59 0.61  CALCIUM 9.2 8.5*   Recent Labs    12/24/23 1621 12/25/23 0629  PROT 7.9 6.8  ALBUMIN 3.9 3.3*  AST 54* 34  ALT 25 21  ALKPHOS  80 65  BILITOT 0.8 0.9  BILIDIR  --  0.1  IBILI  --  0.8   No results for input(s): "INR" in the last 72 hours. No results for input(s): "AFPTUMOR" in the last 72 hours.  CT ABDOMEN PELVIS W CONTRAST CLINICAL DATA:  Mid to lower back pain and vomiting.  EXAM: CT ABDOMEN AND PELVIS WITH CONTRAST  TECHNIQUE: Multidetector CT imaging of the abdomen and pelvis was performed using the standard protocol following bolus administration of intravenous contrast.  RADIATION DOSE REDUCTION: This exam was performed according to the departmental dose-optimization program which includes automated exposure control, adjustment of the mA and/or kV according to Sydney Dalton size and/or use of iterative reconstruction technique.  CONTRAST:  75mL OMNIPAQUE IOHEXOL 350 MG/ML  SOLN  COMPARISON:  January 29, 2023  FINDINGS: Lower chest: No acute abnormality.  Hepatobiliary: There is diffuse fatty infiltration of the liver parenchyma. No focal liver abnormality is seen. No gallstones, gallbladder wall thickening, or biliary dilatation.  Pancreas: Moderate to marked severity peripancreatic inflammatory fat stranding is seen. Innumerable subcentimeter parenchymal calcifications are noted within the head of the pancreas. There is no evidence of pancreatic ductal dilatation  Spleen: Normal in size without focal abnormality.  Adrenals/Urinary Tract: Adrenal glands are unremarkable. Kidneys are normal in size, without obstructing renal calculi, focal lesion, or hydronephrosis. A 2 mm nonobstructing renal calculus is seen within the right kidney. Bladder is unremarkable.  Stomach/Bowel: Stomach is within normal limits. Appendix appears normal. No evidence of bowel dilatation. Mild proximal Peri duodenal inflammatory fat stranding is seen which likely extends from the inflammatory process involving the adjacent pancreatic head.  Vascular/Lymphatic: Mild aortic atherosclerosis. No enlarged abdominal or pelvic lymph nodes.  Reproductive: Ill-defined uterine fibroids are suspected within the uterine fundus. The bilateral adnexa are unremarkable.  Other: No abdominal wall hernia or abnormality. No abdominopelvic ascites.  Musculoskeletal: No acute or significant osseous findings.  IMPRESSION: 1. Moderate to marked severity acute on chronic pancreatitis. 2. Hepatic steatosis. 3. 2 mm nonobstructing right renal calculus. 4. Aortic atherosclerosis.  Aortic Atherosclerosis (ICD10-I70.0).  Electronically Signed   By: Aram Candela M.D.   On: 12/25/2023 03:51   Previous GI Evaluations  EUS June 2024 EGD impression: - No gross lesions in the proximal esophagus and in the mid esophagus. - LA Grade B esophagitis with no bleeding found distally. - Z-line  irregular, 40 cm from the incisors. - 1 cm hiatal hernia. - Erythematous mucosa in the stomach. Biopsied. - Submucosal nodule found in the duodenum (contralateral to major papilla). Biopsied. - Submucosal nodule found in the duodenum (contralateral to major papilla). Biopsied. - Erythematous duodenopathy otherwise in visualized duodenum. EUS impression: - Pancreatic parenchymal abnormalities consisting of hyperechoic foci, lobularity and hyperechoic strands were noted in the entire pancreas. - The pancreatic duct had intraductal stones, had a prominently branched endosonographic appearance, had a tortuous/ectatic appearance and had hyperechoic walls in the pancreatic head, genu of the pancreas, body of the pancreas and tail of the pancreas. -Based on the findings at EUS, this Sydney Dalton has [place] criteria consistent with chronic pancreatitis. - There was no sign of significant pathology in the common bile duct and in the common hepatic duct. - An intramural (subepithelial) lesion was found in the second portion of the duodenum. The lesion appeared to originate from within the deep mucosa (Layer 2). This corresponds with the second submucosal nodule in the duodenum noted above. - No malignant-appearing lymph nodes were visualized in the celiac region (level 20), peripancreatic  region and porta hepatis region.   Colonoscopy June 2024 - Hemorrhoids found on digital rectal exam. - The examined portion of the ileum was normal. - One 4 mm polyp in the ascending colon, removed with a cold snare. Resected and retrieved. - Normal mucosa in the entire examined colon otherwise. - Non-bleeding non-thrombosed internal hemorrhoids.  A. STOMACH, BIOPSY:  - Gastric antral mucosa with mild nonspecific reactive gastropathy  - Gastric oxyntic mucosa with no specific histopathologic changes  - Helicobacter pylori-like organisms are not identified on routine HE  stain   B. DUODENAL NODULARITY, DISTAL, BIOPSY:  - Duodenal  adenoma (low-grade dysplasia)  - Negative for high-grade dysplasia or carcinoma   C. DUODENAL NODULARITY, PROXIMAL, BIOPSY:  - Duodenal mucosa with no specific histopathologic changes  - Negative for increased intraepithelial lymphocytes or villous  architectural changes   D. COLON, ASCENDING, POLYPECTOMY:  - Tubular adenoma  - Negative for high-grade dysplasia or malignancy   Stool studies March 2024 Pancreatic elastase and qual fecal fat were normal   IgG subclass 4 normal, Triglycerides normal   Past Medical History:  Diagnosis Date   Pancreatitis     Past Surgical History:  Procedure Laterality Date   BIOPSY  03/19/2023   Procedure: BIOPSY;  Surgeon: Lemar Lofty., MD;  Location: [facility];  Service: Gastroenterology;;   COLONOSCOPY WITH PROPOFOL N/A 03/19/2023   Procedure: COLONOSCOPY WITH PROPOFOL;  Surgeon: Lemar Lofty., MD;  Location: [facility];  Service: Gastroenterology;  Laterality: N/A;   ESOPHAGOGASTRODUODENOSCOPY (EGD) WITH PROPOFOL N/A 03/19/2023   Procedure: ESOPHAGOGASTRODUODENOSCOPY (EGD) WITH PROPOFOL;  Surgeon: Meridee Score Netty Starring., MD;  Location: [facility];  Service: Gastroenterology;  Laterality: N/A;   EUS N/A 03/19/2023   Procedure: UPPER ENDOSCOPIC ULTRASOUND (EUS) RADIAL;  Surgeon: Lemar Lofty., MD;  Location: [facility];  Service: Gastroenterology;  Laterality: N/A;   POLYPECTOMY  03/19/2023   Procedure: POLYPECTOMY;  Surgeon: Mansouraty, Netty Starring., MD;  Location: [facility];  Service: Gastroenterology;;    Family History  Problem Relation Age of Onset   Dementia Mother    Colon cancer Neg Hx    Pancreatic cancer Neg Hx    Liver cancer Neg Hx    Esophageal cancer Neg Hx    Stomach cancer Neg Hx    Rectal cancer Neg Hx     Prior to Admission medications   Not on File    Current Facility-Administered Medications  Medication Dose Route Frequency Provider Last Rate Last Admin   folic acid  (FOLVITE) tablet 1 mg  1 mg Oral Daily Eduard Clos, MD       heparin injection 5,000 Units  5,000 Units Subcutaneous Q8H Eduard Clos, MD       HYDROmorphone (DILAUDID) injection 0.5 mg  0.5 mg Intravenous Q3H PRN Eduard Clos, MD       lactated ringers infusion   Intravenous Continuous Eduard Clos, MD 150 mL/hr at 12/25/23 0549 New Bag at 12/25/23 0549   LORazepam (ATIVAN) tablet 1-4 mg  1-4 mg Oral Q1H PRN Eduard Clos, MD       Or   LORazepam (ATIVAN) injection 1-4 mg  1-4 mg Intravenous Q1H PRN Eduard Clos, MD       magnesium sulfate IVPB 2 g 50 mL  2 g Intravenous Once Burnadette Pop, MD       multivitamin with minerals tablet 1 tablet  1 tablet Oral Daily Eduard Clos, MD       potassium  chloride 10 mEq in 100 mL IVPB  10 mEq Intravenous Q1 Hr x 3 Eduard Clos, MD 100 mL/hr at 12/25/23 0825 10 mEq at 12/25/23 0825   thiamine (VITAMIN B1) tablet 100 mg  100 mg Oral Daily Eduard Clos, MD       Or   thiamine (VITAMIN B1) injection 100 mg  100 mg Intravenous Daily Eduard Clos, MD       triamcinolone acetonide (KENALOG) 10 MG/ML injection 10 mg  10 mg Other Once Terri Piedra, DO       No current outpatient medications on file.    Allergies as of 12/24/2023   (No Known Allergies)    Social History   Socioeconomic History   Marital status: Single    Spouse name: Not on file   Number of children: 1   Years of education: Not on file   Highest education level: Not on file  Occupational History   Occupation: Works at [employer]  Tobacco Use   Smoking status: Some Days    Types: Cigarettes    Passive exposure: Never   Smokeless tobacco: Not on file  Vaping Use   Vaping status: Never Used  Substance and Sexual Activity   Alcohol use: Yes    Comment: occ   Drug use: Never   Sexual activity: Not on file  Other Topics Concern   Not on file  Social History Narrative   Not on file   Social  Drivers of Health   Financial Resource Strain: Not on file  Food Insecurity: No Food Insecurity (10/29/2022)   Hunger Vital Sign    Worried About Running Out of Food in the Last Year: Never true    Ran Out of Food in the Last Year: Never true  Transportation Needs: No Transportation Needs (10/29/2022)   PRAPARE - Administrator, Civil Service (Medical): No    Lack of Transportation (Non-Medical): No  Physical Activity: Not on file  Stress: Not on file  Social Connections: Not on file  Intimate Partner Violence: Not At Risk (10/29/2022)   Humiliation, Afraid, Rape, and Kick questionnaire    Fear of Current or Ex-Partner: No    Emotionally Abused: No    Physically Abused: No    Sexually Abused: No     Code Status   Code Status: Full Code  Review of Systems: All systems reviewed and negative except where noted in HPI.  Physical Exam: Vital signs in last 24 hours: Temp:  [98 F (36.7 C)-98.5 F (36.9 C)] 98.4 F (36.9 C) (03/19 0900) Pulse Rate:  [45-82] 57 (03/19 0900) Resp:  [14-18] 16 (03/19 0900) BP: (136-168)/(65-101) 153/100 (03/19 0900) SpO2:  [96 %-100 %] 100 % (03/19 0900) Weight:  [56.7 kg] 56.7 kg (03/18 1617)    General:  Pleasant female in NAD Psych:  Cooperative. Normal mood and affect Eyes: Pupils equal Ears:  Normal auditory acuity Nose: No deformity, discharge or lesions Neck:  Supple, no masses felt Lungs:  Clear to auscultation.  Heart:  Regular rate, regular rhythm.  Abdomen:  Soft, nondistended, generalized tenderness, active bowel sounds, no masses felt Rectal :  Deferred Msk: Symmetrical without gross deformities.  Neurologic:  Alert, oriented, grossly normal neurologically Extremities : No edema Skin:  Intact without significant lesions.    Intake/Output from previous day: No intake/output data recorded. Intake/Output this shift:  No intake/output data recorded.   Willette Cluster, NP-C   12/25/2023, 9:35 AM

## 2023-12-25 NOTE — Progress Notes (Signed)
 CSW added substance abuse resources to patient's AVS.  Edwin Dada, MSW, LCSW Transitions of Care  Clinical Social Worker II [phone]

## 2023-12-26 DIAGNOSIS — D649 Anemia, unspecified: Secondary | ICD-10-CM | POA: Diagnosis not present

## 2023-12-26 DIAGNOSIS — E876 Hypokalemia: Secondary | ICD-10-CM | POA: Diagnosis not present

## 2023-12-26 DIAGNOSIS — K852 Alcohol induced acute pancreatitis without necrosis or infection: Secondary | ICD-10-CM | POA: Diagnosis not present

## 2023-12-26 DIAGNOSIS — R197 Diarrhea, unspecified: Secondary | ICD-10-CM

## 2023-12-26 LAB — BASIC METABOLIC PANEL
Anion gap: 14 (ref 5–15)
BUN: <5 mg/dL — ABNORMAL LOW (ref 6–20)
CO2: 24 mmol/L (ref 22–32)
Calcium: 8.6 mg/dL — ABNORMAL LOW (ref 8.9–10.3)
Chloride: 95 mmol/L — ABNORMAL LOW (ref 98–111)
Creatinine, Ser: 0.58 mg/dL (ref 0.44–1.00)
GFR, Estimated: >60 mL/min (ref >60)
Glucose, Bld: 65 mg/dL — ABNORMAL LOW (ref 70–99)
Potassium: 3.1 mmol/L — ABNORMAL LOW (ref 3.5–5.1)
Sodium: 133 mmol/L — ABNORMAL LOW (ref 135–145)

## 2023-12-26 LAB — MAGNESIUM: Magnesium: 1.8 mg/dL (ref 1.7–2.4)

## 2023-12-26 MED ORDER — OXYCODONE HCL 5 MG PO TABS
5.0000 mg | ORAL_TABLET | Freq: Four times a day (QID) | ORAL | Status: DC | PRN
  Administered 2023-12-26: 5 mg via ORAL
  Filled 2023-12-26: qty 1

## 2023-12-26 MED ORDER — POTASSIUM CHLORIDE CRYS ER 20 MEQ PO TBCR
40.0000 meq | EXTENDED_RELEASE_TABLET | ORAL | Status: AC
  Administered 2023-12-26 (×2): 40 meq via ORAL
  Filled 2023-12-26 (×2): qty 2

## 2023-12-26 NOTE — Progress Notes (Signed)
 Daily Progress Note  DOA: 12/24/2023 Hospital Day: 3  Chief Complaint: acute on chronic pancreatitis  ASSESSMENT    48 y.o. year old female with acute on chronic  pancreatitis with PD stones (not removed) . Negative prior workup for etiology of chronic pancreatitis but felt to be Etoh related vrs idiopathic. Acute flare in pancreatitis possibly Etoh related Pain not as bad compared to yesterday. Off IV narcs. Not requiring much pain meds   Hypokalemia K+ 3.1. Repletion in progress  Loose stool ( this am).    Chronic [place] anemia.  Hgb stable in 10-11 range. No overt GI bleeding. Has had prior EGD and colonoscopy    History of duodenal adenoma with low grade dysplasia.  Plan was for repeat EGD to remove lesion but appears not to have been done?    Hiatal hernia / history of reflux esophagitis Asymptomatic, not on any reflux medication.    PLAN   --Will see how she does with full liquids. If tolerates then probably low fat diet tomorrow.  --Monitor BMs for now --Needs Etoh abstinence --Will need outpatient follow up to arrange for removal of duodenal adenoma.   Interim History / Subjective   Pain not as bad compared to yesterday. About to try full liquids. Has had loose stool this am.  At home she typically has formed stools.    Objective   Recent Labs    12/24/23 1621 12/25/23 0629  WBC 8.0 8.0  HGB 11.7* 10.6*  HCT 34.4* 31.4*  MCV 91.7 90.8  PLT 188 161   Recent Labs    12/25/23 0629  FOLATE 7.0  VITAMINB12 328  FERRITIN 23  TIBC 428  IRONPCTSAT 27   Recent Labs    12/24/23 1621 12/25/23 0629 12/26/23 0649  NA 141 136 133*  K 3.0* 3.0* 3.1*  CL 101 98 95*  CO2 27 30 24   GLUCOSE 109* 95 65*  BUN 9 <5* <5*  CREATININE 0.59 0.61 0.58  CALCIUM 9.2 8.5* 8.6*   Recent Labs    12/24/23 1621 12/25/23 0629  PROT 7.9 6.8  ALBUMIN 3.9 3.3*  AST 54* 34  ALT 25 21  ALKPHOS 80 65  BILITOT 0.8 0.9  BILIDIR  --  0.1  IBILI  --  0.8   No  results for input(s): "INR" in the last 72 hours. No results for input(s): "AFPTUMOR" in the last 72 hours.   No results for input(s): "ANA" in the last 72 hours.  Imaging:  CT ABDOMEN PELVIS W CONTRAST CLINICAL DATA:  Mid to lower back pain and vomiting.  EXAM: CT ABDOMEN AND PELVIS WITH CONTRAST  TECHNIQUE: Multidetector CT imaging of the abdomen and pelvis was performed using the standard protocol following bolus administration of intravenous contrast.  RADIATION DOSE REDUCTION: This exam was performed according to the departmental dose-optimization program which includes automated exposure control, adjustment of the mA and/or kV according to patient size and/or use of iterative reconstruction technique.  CONTRAST:  75mL OMNIPAQUE IOHEXOL 350 MG/ML SOLN  COMPARISON:  January 29, 2023  FINDINGS: Lower chest: No acute abnormality.  Hepatobiliary: There is diffuse fatty infiltration of the liver parenchyma. No focal liver abnormality is seen. No gallstones, gallbladder wall thickening, or biliary dilatation.  Pancreas: Moderate to marked severity peripancreatic inflammatory fat stranding is seen. Innumerable subcentimeter parenchymal calcifications are noted within the head of the pancreas. There is no evidence of pancreatic ductal dilatation  Spleen: Normal in size without focal abnormality.  Adrenals/Urinary  Tract: Adrenal glands are unremarkable. Kidneys are normal in size, without obstructing renal calculi, focal lesion, or hydronephrosis. A 2 mm nonobstructing renal calculus is seen within the right kidney. Bladder is unremarkable.  Stomach/Bowel: Stomach is within normal limits. Appendix appears normal. No evidence of bowel dilatation. Mild proximal Peri duodenal inflammatory fat stranding is seen which likely extends from the inflammatory process involving the adjacent pancreatic head.  Vascular/Lymphatic: Mild aortic atherosclerosis. No enlarged abdominal or  pelvic lymph nodes.  Reproductive: Ill-defined uterine fibroids are suspected within the uterine fundus. The bilateral adnexa are unremarkable.  Other: No abdominal wall hernia or abnormality. No abdominopelvic ascites.  Musculoskeletal: No acute or significant osseous findings.  IMPRESSION: 1. Moderate to marked severity acute on chronic pancreatitis. 2. Hepatic steatosis. 3. 2 mm nonobstructing right renal calculus. 4. Aortic atherosclerosis.  Aortic Atherosclerosis (ICD10-I70.0).  Electronically Signed   By: Aram Candela M.D.   On: 12/25/2023 03:51     Scheduled inpatient medications:   folic acid  1 mg Oral Daily   heparin  5,000 Units Subcutaneous Q8H   multivitamin with minerals  1 tablet Oral Daily   potassium chloride  40 mEq Oral Q1 Hr x 2   thiamine  100 mg Oral Daily   Continuous inpatient infusions:  PRN inpatient medications: LORazepam **OR** LORazepam, oxyCODONE  Vital signs in last 24 hours: Temp:  [98.2 F (36.8 C)-98.8 F (37.1 C)] 98.7 F (37.1 C) (03/20 0835) Pulse Rate:  [57-64] 57 (03/20 0835) Resp:  [18] 18 (03/20 0436) BP: (138-157)/(74-90) 138/78 (03/20 0835) SpO2:  [83 %-100 %] 98 % (03/20 0835) Last BM Date : 12/26/23  Intake/Output Summary (Last 24 hours) at 12/26/2023 1200 Last data filed at 12/26/2023 0314 Gross per 24 hour  Intake 3039.36 ml  Output --  Net 3039.36 ml    Intake/Output from previous day: 03/19 0701 - 03/20 0700 In: 3039.4 [I.V.:2937.3; IV Piggyback:102] Out: -  Intake/Output this shift: No intake/output data recorded.   Physical Exam:  General: Alert female in NAD Heart:  Regular rate and rhythm.  Pulmonary: Normal respiratory effort Abdomen: Soft, nondistended, nontender. Normal bowel sounds. Extremities: No lower extremity edema  Neurologic: Alert and oriented Psych: Pleasant. Cooperative     LOS: 1 day   Willette Cluster ,NP 12/26/2023, 12:00 PM

## 2023-12-26 NOTE — Progress Notes (Signed)
 PROGRESS NOTE  Sydney Dalton  ZOX:[id number] DOB: Mar 06, 1976 DOA: 12/24/2023 PCP: Patient, No Pcp Per   Brief Narrative: Patient is a 48 year old female with history of chronic alcoholism, recurrent pancreatitis who presented with severe abdomen pain, vomiting.  Pain was mostly in the epigastric region sometimes radiating to the back.  On presentation, labs showed lipase of 815, potassium of 3.  Patient was admitted for the management of acute  pancreatitis.  Started on IV fluids.  Started on clear liquid diet today  Assessment & Plan:  Principal Problem:   Acute pancreatitis Active Problems:   Alcohol use   Anemia   Elevated blood pressure reading    Acute on chronic pancreatitis: Likely due to binge alcohol drinking.  Drink alcohol last weekend while at a celebration.  CT abdomen/pelvis showed moderate to marked severe acute on chronic pancreatitis, hepatic steatosis, renal calculus. Also has history of duodenal adenoma, GI will set up EGD as an outpatient. Abdomen pain is better today.  She had an episode of diarrhea.  Abdomen is less tender.  No nausea or vomiting.  Started on clear liquid.  We will advance as tolerated  Normocytic anemia: Currently hemoglobin stable  Hypokalemia/hypomagnesemia: Supplemented with potassium, magnesium   Elevated blood pressure: Currently blood pressure stable  Chronic alcohol use: Continue to monitor for withdrawal.  Continue thiamine folic acid.On ciwa.  UDS positive for THC         DVT prophylaxis:heparin injection 5,000 Units Start: 12/25/23 0615     Code Status: Full Code  Family Communication: None at beside  Patient status:Inpatient  Patient is from :Home  Anticipated discharge WJ:XBJY  Estimated DC date:1-2 days   Consultants: GI  Procedures:None  Antimicrobials:  Anti-infectives (From admission, onward)    None       Subjective: Patient seen and examined at bedside today.  Feels better today.   Abdomen pain is better.  No nausea or vomiting.  Had a diarrhea last night.  Eager to start clear liquid  Objective: Vitals:   12/25/23 2049 12/26/23 0001 12/26/23 0436 12/26/23 0835  BP: (!) 145/90 (!) 146/74 (!) 157/77 138/78  Pulse: 64 60 (!) 57 (!) 57  Resp: 18 18 18    Temp: 98.4 F (36.9 C) 98.2 F (36.8 C) 98.6 F (37 C) 98.7 F (37.1 C)  TempSrc: Oral Oral Oral Oral  SpO2: 100% 100% (!) 83% 98%  Weight:      Height:        Intake/Output Summary (Last 24 hours) at 12/26/2023 1039 Last data filed at 12/26/2023 0314 Gross per 24 hour  Intake 3039.36 ml  Output --  Net 3039.36 ml   Filed Weights   12/24/23 1617  Weight: 56.7 kg    Examination:  General exam: Overall comfortable, not in distress HEENT: PERRL Respiratory system:  no wheezes or crackles  Cardiovascular system: S1 & S2 heard, RRR.  Gastrointestinal system: Abdomen is nondistended, soft .  Mild generalized tenderness Central nervous system: Alert and oriented Extremities: No edema, no clubbing ,no cyanosis Skin: No rashes, no ulcers,no icterus     Data Reviewed: I have personally reviewed following labs and imaging studies  CBC: Recent Labs  Lab 12/24/23 1621 12/25/23 0629  WBC 8.0 8.0  NEUTROABS 6.3 5.4  HGB 11.7* 10.6*  HCT 34.4* 31.4*  MCV 91.7 90.8  PLT 188 161   Basic Metabolic Panel: Recent Labs  Lab 12/24/23 1621 12/25/23 0629 12/26/23 0649  NA 141 136 133*  K 3.0* 3.0* 3.1*  CL 101 98 95*  CO2 27 30 24   GLUCOSE 109* 95 65*  BUN 9 <5* <5*  CREATININE 0.59 0.61 0.58  CALCIUM 9.2 8.5* 8.6*  MG  --  1.5* 1.8     Recent Results (from the past 240 hours)  Resp panel by RT-PCR (RSV, Flu A&B, Covid) Anterior Nasal Swab     Status: None   Collection Time: 12/24/23  4:21 PM   Specimen: Anterior Nasal Swab  Result Value Ref Range Status   SARS Coronavirus 2 by RT PCR NEGATIVE NEGATIVE Final   Influenza A by PCR NEGATIVE NEGATIVE Final   Influenza B by PCR NEGATIVE NEGATIVE  Final    Comment: (NOTE) The Xpert Xpress SARS-CoV-2/FLU/RSV plus assay is intended as an aid in the diagnosis of influenza from Nasopharyngeal swab specimens and should not be used as a sole basis for treatment. Nasal washings and aspirates are unacceptable for Xpert Xpress SARS-CoV-2/FLU/RSV testing.  Fact Sheet for Patients: BloggerCourse.com  Fact Sheet for Healthcare Providers: SeriousBroker.it  This test is not yet approved or cleared by the [place] FDA and has been authorized for detection and/or diagnosis of SARS-CoV-2 by FDA under an Emergency Use Authorization (EUA). This EUA will remain in effect (meaning this test can be used) for the duration of the COVID-19 declaration under Section 564(b)(1) of the Act, 21 U.S.C. section 360bbb-3(b)(1), unless the authorization is terminated or revoked.     Resp Syncytial Virus by PCR NEGATIVE NEGATIVE Final    Comment: (NOTE) Fact Sheet for Patients: BloggerCourse.com  Fact Sheet for Healthcare Providers: SeriousBroker.it  This test is not yet approved or cleared by the [place] FDA and has been authorized for detection and/or diagnosis of SARS-CoV-2 by FDA under an Emergency Use Authorization (EUA). This EUA will remain in effect (meaning this test can be used) for the duration of the COVID-19 declaration under Section 564(b)(1) of the Act, 21 U.S.C. section 360bbb-3(b)(1), unless the authorization is terminated or revoked.  Performed at [facility], [address] [place], [place] [address]      Radiology Studies: CT ABDOMEN PELVIS W CONTRAST Result Date: 12/25/2023 CLINICAL DATA:  Mid to lower back pain and vomiting. EXAM: CT ABDOMEN AND PELVIS WITH CONTRAST TECHNIQUE: Multidetector CT imaging of the abdomen and pelvis was performed using the standard protocol following bolus administration of  intravenous contrast. RADIATION DOSE REDUCTION: This exam was performed according to the departmental dose-optimization program which includes automated exposure control, adjustment of the mA and/or kV according to patient size and/or use of iterative reconstruction technique. CONTRAST:  75mL OMNIPAQUE IOHEXOL 350 MG/ML SOLN COMPARISON:  January 29, 2023 FINDINGS: Lower chest: No acute abnormality. Hepatobiliary: There is diffuse fatty infiltration of the liver parenchyma. No focal liver abnormality is seen. No gallstones, gallbladder wall thickening, or biliary dilatation. Pancreas: Moderate to marked severity peripancreatic inflammatory fat stranding is seen. Innumerable subcentimeter parenchymal calcifications are noted within the head of the pancreas. There is no evidence of pancreatic ductal dilatation Spleen: Normal in size without focal abnormality. Adrenals/Urinary Tract: Adrenal glands are unremarkable. Kidneys are normal in size, without obstructing renal calculi, focal lesion, or hydronephrosis. A 2 mm nonobstructing renal calculus is seen within the right kidney. Bladder is unremarkable. Stomach/Bowel: Stomach is within normal limits. Appendix appears normal. No evidence of bowel dilatation. Mild proximal Peri duodenal inflammatory fat stranding is seen which likely extends from the inflammatory process involving the adjacent pancreatic head. Vascular/Lymphatic: Mild aortic  atherosclerosis. No enlarged abdominal or pelvic lymph nodes. Reproductive: Ill-defined uterine fibroids are suspected within the uterine fundus. The bilateral adnexa are unremarkable. Other: No abdominal wall hernia or abnormality. No abdominopelvic ascites. Musculoskeletal: No acute or significant osseous findings. IMPRESSION: 1. Moderate to marked severity acute on chronic pancreatitis. 2. Hepatic steatosis. 3. 2 mm nonobstructing right renal calculus. 4. Aortic atherosclerosis. Aortic Atherosclerosis (ICD10-I70.0). Electronically  Signed   By: Aram Candela M.D.   On: 12/25/2023 03:51    Scheduled Meds:  folic acid  1 mg Oral Daily   heparin  5,000 Units Subcutaneous Q8H   multivitamin with minerals  1 tablet Oral Daily   thiamine  100 mg Oral Daily   Continuous Infusions:   LOS: 1 day   Burnadette Pop, MD [facility] P3/20/2025, 10:39 AM

## 2023-12-26 NOTE — Progress Notes (Signed)
   12/26/23 1538  TOC Brief Assessment  Insurance and Status Reviewed (Medicaid [employer])  Patient has primary care physician No (PCP requested)  Home environment has been reviewed from home  Prior level of function: independent  Prior/Current Home Services No current home services  Social Drivers of Health Review SDOH reviewed interventions complete (Smoking cessation information attached to DC instructions)  Readmission risk has been reviewed Yes (10%)  Transition of care needs transition of care needs identified, TOC will continue to follow (PCP requested  DC instructions for SDOH attached)   Following for any additional recs. Please place [facility] consult if necessary

## 2023-12-26 NOTE — ED Provider Notes (Signed)
 [facility] Provider Note   CSN: [id number] Arrival date & time: 12/24/23  1550     History  Chief Complaint  Patient presents with   Back Pain    Sydney Dalton is a 48 y.o. female.  48 year old female who presents ER today secondary to abdominal pain and back pain with associated emesis has been going on throughout the day today.  Patient states that this happened 1 time in the past and she was found to have pancreatitis but she thinks they were unclear on why.  This was about a year ago.  Patient states she does drink alcohol but last week alcohol on Thursday.  And did not drink too much.   Back Pain      Home Medications Prior to Admission medications   Not on File      Allergies    Patient has no known allergies.    Review of Systems   Review of Systems  Musculoskeletal:  Positive for back pain.    Physical Exam Updated Vital Signs BP (!) 157/77 (BP Location: Right Arm)   Pulse (!) 57   Temp 98.6 F (37 C) (Oral)   Resp 18   Ht 5' 5.5" (1.664 m)   Wt 56.7 kg   SpO2 (!) 83%   BMI 20.48 kg/m  Physical Exam Vitals and nursing note reviewed.  Constitutional:      Appearance: She is well-developed.  HENT:     Head: Normocephalic and atraumatic.  Cardiovascular:     Rate and Rhythm: Normal rate and regular rhythm.  Pulmonary:     Effort: No respiratory distress.     Breath sounds: No stridor.  Abdominal:     General: There is no distension.     Tenderness: There is abdominal tenderness.  Musculoskeletal:     Cervical back: Normal range of motion.  Skin:    General: Skin is warm and dry.  Neurological:     Mental Status: She is alert.     ED Results / Procedures / Treatments   Labs (all labs ordered are listed, but only abnormal results are displayed) Labs Reviewed  CBC WITH DIFFERENTIAL/PLATELET - Abnormal; Notable for the following components:      Result Value   RBC 3.75 (*)    Hemoglobin 11.7 (*)    HCT 34.4 (*)     RDW 17.0 (*)    All other components within normal limits  URINALYSIS, ROUTINE W REFLEX MICROSCOPIC - Abnormal; Notable for the following components:   APPearance HAZY (*)    Ketones, ur 20 (*)    Protein, ur 100 (*)    Bacteria, UA RARE (*)    All other components within normal limits  COMPREHENSIVE METABOLIC PANEL - Abnormal; Notable for the following components:   Potassium 3.0 (*)    Glucose, Bld 109 (*)    AST 54 (*)    All other components within normal limits  LIPASE, BLOOD - Abnormal; Notable for the following components:   Lipase 815 (*)    All other components within normal limits  BASIC METABOLIC PANEL - Abnormal; Notable for the following components:   Potassium 3.0 (*)    BUN <5 (*)    Calcium 8.5 (*)    All other components within normal limits  HEPATIC FUNCTION PANEL - Abnormal; Notable for the following components:   Albumin 3.3 (*)    All other components within normal limits  MAGNESIUM - Abnormal; Notable for  the following components:   Magnesium 1.5 (*)    All other components within normal limits  CBC WITH DIFFERENTIAL/PLATELET - Abnormal; Notable for the following components:   RBC 3.46 (*)    Hemoglobin 10.6 (*)    HCT 31.4 (*)    RDW 16.9 (*)    Abs Immature Granulocytes 0.09 (*)    All other components within normal limits  RAPID URINE DRUG SCREEN, HOSP PERFORMED - Abnormal; Notable for the following components:   Tetrahydrocannabinol POSITIVE (*)    All other components within normal limits  RETICULOCYTES - Abnormal; Notable for the following components:   RBC. 3.46 (*)    All other components within normal limits  RESP PANEL BY RT-PCR (RSV, FLU A&B, COVID)  RVPGX2  HIV ANTIBODY (ROUTINE TESTING W REFLEX)  PREGNANCY, URINE  TRIGLYCERIDES  VITAMIN B12  FOLATE  IRON AND TIBC  FERRITIN  CBC  BASIC METABOLIC PANEL  MAGNESIUM    EKG None  Radiology CT ABDOMEN PELVIS W CONTRAST Result Date: 12/25/2023 CLINICAL DATA:  Mid to lower back  pain and vomiting. EXAM: CT ABDOMEN AND PELVIS WITH CONTRAST TECHNIQUE: Multidetector CT imaging of the abdomen and pelvis was performed using the standard protocol following bolus administration of intravenous contrast. RADIATION DOSE REDUCTION: This exam was performed according to the departmental dose-optimization program which includes automated exposure control, adjustment of the mA and/or kV according to patient size and/or use of iterative reconstruction technique. CONTRAST:  75mL OMNIPAQUE IOHEXOL 350 MG/ML SOLN COMPARISON:  January 29, 2023 FINDINGS: Lower chest: No acute abnormality. Hepatobiliary: There is diffuse fatty infiltration of the liver parenchyma. No focal liver abnormality is seen. No gallstones, gallbladder wall thickening, or biliary dilatation. Pancreas: Moderate to marked severity peripancreatic inflammatory fat stranding is seen. Innumerable subcentimeter parenchymal calcifications are noted within the head of the pancreas. There is no evidence of pancreatic ductal dilatation Spleen: Normal in size without focal abnormality. Adrenals/Urinary Tract: Adrenal glands are unremarkable. Kidneys are normal in size, without obstructing renal calculi, focal lesion, or hydronephrosis. A 2 mm nonobstructing renal calculus is seen within the right kidney. Bladder is unremarkable. Stomach/Bowel: Stomach is within normal limits. Appendix appears normal. No evidence of bowel dilatation. Mild proximal Peri duodenal inflammatory fat stranding is seen which likely extends from the inflammatory process involving the adjacent pancreatic head. Vascular/Lymphatic: Mild aortic atherosclerosis. No enlarged abdominal or pelvic lymph nodes. Reproductive: Ill-defined uterine fibroids are suspected within the uterine fundus. The bilateral adnexa are unremarkable. Other: No abdominal wall hernia or abnormality. No abdominopelvic ascites. Musculoskeletal: No acute or significant osseous findings. IMPRESSION: 1. Moderate  to marked severity acute on chronic pancreatitis. 2. Hepatic steatosis. 3. 2 mm nonobstructing right renal calculus. 4. Aortic atherosclerosis. Aortic Atherosclerosis (ICD10-I70.0). Electronically Signed   By: Aram Candela M.D.   On: 12/25/2023 03:51    Procedures Procedures    Medications Ordered in ED Medications  lactated ringers infusion ( Intravenous Stopped 12/26/23 0249)  heparin injection 5,000 Units (5,000 Units Subcutaneous Not Given 12/26/23 0552)  LORazepam (ATIVAN) tablet 1-4 mg (has no administration in time range)    Or  LORazepam (ATIVAN) injection 1-4 mg (has no administration in time range)  thiamine (VITAMIN B1) tablet 100 mg ( Oral See Alternative 12/25/23 1106)    Or  thiamine (VITAMIN B1) injection 100 mg (100 mg Intravenous Given 12/25/23 1106)  folic acid (FOLVITE) tablet 1 mg (1 mg Oral Given 12/25/23 1106)  multivitamin with minerals tablet 1 tablet (1 tablet Oral Patient Refused/Not  Given 12/25/23 1106)  HYDROmorphone (DILAUDID) injection 0.5 mg (0.5 mg Intravenous Given 12/25/23 2031)  HYDROmorphone (DILAUDID) injection 1 mg (1 mg Intravenous Given 12/25/23 0256)  ondansetron (ZOFRAN) injection 4 mg (4 mg Intravenous Given 12/25/23 0256)  lactated ringers bolus 1,000 mL (0 mLs Intravenous Stopped 12/25/23 0356)  iohexol (OMNIPAQUE) 350 MG/ML injection 75 mL (75 mLs Intravenous Contrast Given 12/25/23 0344)  lactated ringers bolus 1,000 mL (0 mLs Intravenous Stopped 12/25/23 0527)  potassium chloride 10 mEq in 100 mL IVPB (0 mEq Intravenous Stopped 12/25/23 1411)  potassium chloride SA (KLOR-CON M) CR tablet 40 mEq (40 mEq Oral Given 12/25/23 0828)  magnesium sulfate IVPB 2 g 50 mL (0 g Intravenous Stopped 12/25/23 1109)    ED Course/ Medical Decision Making/ A&P                                 Medical Decision Making Amount and/or Complexity of Data Reviewed Radiology: ordered.  Risk Prescription drug management. Decision regarding  hospitalization.   Patient with pancreatitis.  Lipase elevated, CT scan shows pancreatitis without any significant complications.  Overall patient appears well.  Medications provided.  Fluids started.  NPO.  Will discuss with hospitalist for admission.    Final Clinical Impression(s) / ED Diagnoses Final diagnoses:  Acute pancreatitis, unspecified complication status, unspecified pancreatitis type    Rx / DC Orders ED Discharge Orders     None         Leshaun Biebel, Barbara Cower, MD 12/26/23 [phone]

## 2023-12-26 NOTE — Plan of Care (Signed)

## 2023-12-27 ENCOUNTER — Other Ambulatory Visit (HOSPITAL_COMMUNITY): Payer: Self-pay

## 2023-12-27 DIAGNOSIS — K852 Alcohol induced acute pancreatitis without necrosis or infection: Secondary | ICD-10-CM | POA: Diagnosis not present

## 2023-12-27 LAB — BASIC METABOLIC PANEL
Anion gap: 9 (ref 5–15)
BUN: <5 mg/dL — ABNORMAL LOW (ref 6–20)
CO2: 24 mmol/L (ref 22–32)
Calcium: 8.4 mg/dL — ABNORMAL LOW (ref 8.9–10.3)
Chloride: 100 mmol/L (ref 98–111)
Creatinine, Ser: 0.57 mg/dL (ref 0.44–1.00)
GFR, Estimated: >60 mL/min (ref >60)
Glucose, Bld: 83 mg/dL (ref 70–99)
Potassium: 3.1 mmol/L — ABNORMAL LOW (ref 3.5–5.1)
Sodium: 133 mmol/L — ABNORMAL LOW (ref 135–145)

## 2023-12-27 MED ORDER — THIAMINE HCL 100 MG PO TABS
100.0000 mg | ORAL_TABLET | Freq: Every day | ORAL | 0 refills | Status: AC
  Filled 2023-12-27: qty 60, 60d supply, fill #0

## 2023-12-27 MED ORDER — FOLIC ACID 1 MG PO TABS
1.0000 mg | ORAL_TABLET | Freq: Every day | ORAL | 0 refills | Status: AC
Start: 2023-12-28 — End: ?
  Filled 2023-12-27: qty 60, 60d supply, fill #0

## 2023-12-27 MED ORDER — POTASSIUM CHLORIDE 20 MEQ PO PACK
40.0000 meq | PACK | ORAL | Status: AC
  Administered 2023-12-27 (×2): 40 meq via ORAL
  Filled 2023-12-27 (×2): qty 2

## 2023-12-27 MED ORDER — POTASSIUM CHLORIDE CRYS ER 20 MEQ PO TBCR
40.0000 meq | EXTENDED_RELEASE_TABLET | ORAL | Status: DC
  Filled 2023-12-27: qty 2

## 2023-12-27 MED ORDER — POTASSIUM CHLORIDE 20 MEQ PO PACK
40.0000 meq | PACK | Freq: Every day | ORAL | 0 refills | Status: AC
  Filled 2023-12-27: qty 10, 5d supply, fill #0

## 2023-12-27 NOTE — Discharge Summary (Signed)
 Physician Discharge Summary  Sydney Dalton:[id number] DOB: 1975/11/10 DOA: 12/24/2023  PCP: Patient, No Pcp Per  Admit date: 12/24/2023 Discharge date: 12/27/2023  Admitted From: Home Disposition:  Home  Discharge Condition:Stable CODE STATUS:FULL Diet recommendation: Regular / Dysphagia   Brief/Interim Summary: Patient is a 48 year old female with history of chronic alcoholism, recurrent pancreatitis who presented with severe abdomen pain, vomiting.  Pain was mostly in the epigastric region sometimes radiating to the back.  On presentation, labs showed lipase of 815, potassium of 3.  Patient was admitted for the management of acute  pancreatitis.  Started on IV fluids.  Diet gradually advanced to soft.  Overall tolerating.  No nausea or vomiting.  Abdomen is distended, soft and mostly nontender.  Medically stable for discharge home today.  Counseled for cessation of alcohol.  Following problems were addressed during the hospitalization:  Acute on chronic pancreatitis: Likely due to binge alcohol drinking.  Drink alcohol last weekend while at a celebration.  CT abdomen/pelvis showed moderate to marked severe acute on chronic pancreatitis, hepatic steatosis, renal calculus. Also has history of duodenal adenoma, GI will set up EGD as an outpatient. Diet gradually advanced to soft.  Overall tolerating.  No nausea or vomiting.  Abdomen is distended, soft and mostly nontender.   Normocytic anemia: Currently hemoglobin stable   Hypokalemia/hypomagnesemia: Supplemented with potassium, magnesium    Elevated blood pressure: Currently blood pressure stable   Chronic alcohol use: Continue thiamine folic acid. UDS positive for THC.  Counseled for cessation  Discharge Diagnoses:  Principal Problem:   Acute pancreatitis Active Problems:   Alcohol use   Anemia   Elevated blood pressure reading    Discharge Instructions  Discharge Instructions     Diet general   Complete by: As  directed    Discharge instructions   Complete by: As directed    1)Please quit alcohol 2)Take prescribed medications as instructed   Increase activity slowly   Complete by: As directed       Allergies as of 12/27/2023   No Known Allergies      Medication List     TAKE these medications    folic acid 1 MG tablet Commonly known as: FOLVITE Take 1 tablet (1 mg total) by mouth daily. Start taking on: December 28, 2023   potassium chloride 20 MEQ packet Commonly known as: KLOR-CON Take 40 mEq by mouth daily for 5 days. Start taking on: December 28, 2023   thiamine 100 MG tablet Commonly known as: Vitamin B-1 Take 1 tablet (100 mg total) by mouth daily. Start taking on: December 28, 2023        No Known Allergies  Consultations: GI   Procedures/Studies: CT ABDOMEN PELVIS W CONTRAST Result Date: 12/25/2023 CLINICAL DATA:  Mid to lower back pain and vomiting. EXAM: CT ABDOMEN AND PELVIS WITH CONTRAST TECHNIQUE: Multidetector CT imaging of the abdomen and pelvis was performed using the standard protocol following bolus administration of intravenous contrast. RADIATION DOSE REDUCTION: This exam was performed according to the departmental dose-optimization program which includes automated exposure control, adjustment of the mA and/or kV according to patient size and/or use of iterative reconstruction technique. CONTRAST:  75mL OMNIPAQUE IOHEXOL 350 MG/ML SOLN COMPARISON:  January 29, 2023 FINDINGS: Lower chest: No acute abnormality. Hepatobiliary: There is diffuse fatty infiltration of the liver parenchyma. No focal liver abnormality is seen. No gallstones, gallbladder wall thickening, or biliary dilatation. Pancreas: Moderate to marked severity peripancreatic inflammatory fat stranding is seen. Innumerable subcentimeter parenchymal  calcifications are noted within the head of the pancreas. There is no evidence of pancreatic ductal dilatation Spleen: Normal in size without focal abnormality.  Adrenals/Urinary Tract: Adrenal glands are unremarkable. Kidneys are normal in size, without obstructing renal calculi, focal lesion, or hydronephrosis. A 2 mm nonobstructing renal calculus is seen within the right kidney. Bladder is unremarkable. Stomach/Bowel: Stomach is within normal limits. Appendix appears normal. No evidence of bowel dilatation. Mild proximal Peri duodenal inflammatory fat stranding is seen which likely extends from the inflammatory process involving the adjacent pancreatic head. Vascular/Lymphatic: Mild aortic atherosclerosis. No enlarged abdominal or pelvic lymph nodes. Reproductive: Ill-defined uterine fibroids are suspected within the uterine fundus. The bilateral adnexa are unremarkable. Other: No abdominal wall hernia or abnormality. No abdominopelvic ascites. Musculoskeletal: No acute or significant osseous findings. IMPRESSION: 1. Moderate to marked severity acute on chronic pancreatitis. 2. Hepatic steatosis. 3. 2 mm nonobstructing right renal calculus. 4. Aortic atherosclerosis. Aortic Atherosclerosis (ICD10-I70.0). Electronically Signed   By: Aram Candela M.D.   On: 12/25/2023 03:51      Subjective: Patient seen and examined at bedside today.  Hemodynamically stable for discharge.  Comfortable.  No severe abdominal pain, nausea or vomiting.  Tolerating soft/full liquid diet.  Discharge Exam: Vitals:   12/27/23 0427 12/27/23 0735  BP: (!) 148/78 129/74  Pulse: 62 (!) 52  Resp: 18   Temp: 98.9 F (37.2 C) 98.6 F (37 C)  SpO2: 100% 100%   Vitals:   12/26/23 2020 12/26/23 2347 12/27/23 0427 12/27/23 0735  BP: (!) 137/95 134/78 (!) 148/78 129/74  Pulse: 84 64 62 (!) 52  Resp: 18 18 18    Temp: 98.9 F (37.2 C) 99.5 F (37.5 C) 98.9 F (37.2 C) 98.6 F (37 C)  TempSrc: Oral Oral Oral Oral  SpO2: 100% 100% 100% 100%  Weight:      Height:        General: Pt is alert, awake, not in acute distress Cardiovascular: RRR, S1/S2 +, no rubs, no  gallops Respiratory: CTA bilaterally, no wheezing, no rhonchi Abdominal: Soft, NT, ND, bowel sounds + Extremities: no edema, no cyanosis    The results of significant diagnostics from this hospitalization (including imaging, microbiology, ancillary and laboratory) are listed below for reference.     Microbiology: Recent Results (from the past 240 hours)  Resp panel by RT-PCR (RSV, Flu A&B, Covid) Anterior Nasal Swab     Status: None   Collection Time: 12/24/23  4:21 PM   Specimen: Anterior Nasal Swab  Result Value Ref Range Status   SARS Coronavirus 2 by RT PCR NEGATIVE NEGATIVE Final   Influenza A by PCR NEGATIVE NEGATIVE Final   Influenza B by PCR NEGATIVE NEGATIVE Final    Comment: (NOTE) The Xpert Xpress SARS-CoV-2/FLU/RSV plus assay is intended as an aid in the diagnosis of influenza from Nasopharyngeal swab specimens and should not be used as a sole basis for treatment. Nasal washings and aspirates are unacceptable for Xpert Xpress SARS-CoV-2/FLU/RSV testing.  Fact Sheet for Patients: BloggerCourse.com  Fact Sheet for Healthcare Providers: SeriousBroker.it  This test is not yet approved or cleared by the [place] FDA and has been authorized for detection and/or diagnosis of SARS-CoV-2 by FDA under an Emergency Use Authorization (EUA). This EUA will remain in effect (meaning this test can be used) for the duration of the COVID-19 declaration under Section 564(b)(1) of the Act, 21 U.S.C. section 360bbb-3(b)(1), unless the authorization is terminated or revoked.     Resp Syncytial Virus  by PCR NEGATIVE NEGATIVE Final    Comment: (NOTE) Fact Sheet for Patients: BloggerCourse.com  Fact Sheet for Healthcare Providers: SeriousBroker.it  This test is not yet approved or cleared by the [place] FDA and has been authorized for detection and/or diagnosis of  SARS-CoV-2 by FDA under an Emergency Use Authorization (EUA). This EUA will remain in effect (meaning this test can be used) for the duration of the COVID-19 declaration under Section 564(b)(1) of the Act, 21 U.S.C. section 360bbb-3(b)(1), unless the authorization is terminated or revoked.  Performed at [facility], [address] [place], [place] [address]      Labs: BNP (last 3 results) No results for input(s): "BNP" in the last 8760 hours. Basic Metabolic Panel: Recent Labs  Lab 12/24/23 1621 12/25/23 0629 12/26/23 0649 12/27/23 0610  NA 141 136 133* 133*  K 3.0* 3.0* 3.1* 3.1*  CL 101 98 95* 100  CO2 27 30 24 24   GLUCOSE 109* 95 65* 83  BUN 9 <5* <5* <5*  CREATININE 0.59 0.61 0.58 0.57  CALCIUM 9.2 8.5* 8.6* 8.4*  MG  --  1.5* 1.8  --    Liver Function Tests: Recent Labs  Lab 12/24/23 1621 12/25/23 0629  AST 54* 34  ALT 25 21  ALKPHOS 80 65  BILITOT 0.8 0.9  PROT 7.9 6.8  ALBUMIN 3.9 3.3*   Recent Labs  Lab 12/24/23 1621  LIPASE 815*   No results for input(s): "AMMONIA" in the last 168 hours. CBC: Recent Labs  Lab 12/24/23 1621 12/25/23 0629  WBC 8.0 8.0  NEUTROABS 6.3 5.4  HGB 11.7* 10.6*  HCT 34.4* 31.4*  MCV 91.7 90.8  PLT 188 161   Cardiac Enzymes: No results for input(s): "CKTOTAL", "CKMB", "CKMBINDEX", "TROPONINI" in the last 168 hours. BNP: Invalid input(s): "POCBNP" CBG: No results for input(s): "GLUCAP" in the last 168 hours. D-Dimer No results for input(s): "DDIMER" in the last 72 hours. Hgb A1c No results for input(s): "HGBA1C" in the last 72 hours. Lipid Profile Recent Labs    12/25/23 0629  TRIG 48   Thyroid function studies No results for input(s): "TSH", "T4TOTAL", "T3FREE", "THYROIDAB" in the last 72 hours.  Invalid input(s): "FREET3" Anemia work up Recent Labs    12/25/23 0629  VITAMINB12 328  FOLATE 7.0  FERRITIN 23  TIBC 428  IRON 117  RETICCTPCT 0.8   Urinalysis    Component Value Date/Time    COLORURINE YELLOW 12/24/2023 1621   APPEARANCEUR HAZY (A) 12/24/2023 1621   LABSPEC 1.026 12/24/2023 1621   PHURINE 7.0 12/24/2023 1621   GLUCOSEU NEGATIVE 12/24/2023 1621   HGBUR NEGATIVE 12/24/2023 1621   BILIRUBINUR NEGATIVE 12/24/2023 1621   KETONESUR 20 (A) 12/24/2023 1621   PROTEINUR 100 (A) 12/24/2023 1621   UROBILINOGEN 0.2 06/09/2014 1611   NITRITE NEGATIVE 12/24/2023 1621   LEUKOCYTESUR NEGATIVE 12/24/2023 1621   Sepsis Labs Recent Labs  Lab 12/24/23 1621 12/25/23 0629  WBC 8.0 8.0   Microbiology Recent Results (from the past 240 hours)  Resp panel by RT-PCR (RSV, Flu A&B, Covid) Anterior Nasal Swab     Status: None   Collection Time: 12/24/23  4:21 PM   Specimen: Anterior Nasal Swab  Result Value Ref Range Status   SARS Coronavirus 2 by RT PCR NEGATIVE NEGATIVE Final   Influenza A by PCR NEGATIVE NEGATIVE Final   Influenza B by PCR NEGATIVE NEGATIVE Final    Comment: (NOTE) The Xpert Xpress SARS-CoV-2/FLU/RSV plus assay is intended as  an aid in the diagnosis of influenza from Nasopharyngeal swab specimens and should not be used as a sole basis for treatment. Nasal washings and aspirates are unacceptable for Xpert Xpress SARS-CoV-2/FLU/RSV testing.  Fact Sheet for Patients: BloggerCourse.com  Fact Sheet for Healthcare Providers: SeriousBroker.it  This test is not yet approved or cleared by the [place] FDA and has been authorized for detection and/or diagnosis of SARS-CoV-2 by FDA under an Emergency Use Authorization (EUA). This EUA will remain in effect (meaning this test can be used) for the duration of the COVID-19 declaration under Section 564(b)(1) of the Act, 21 U.S.C. section 360bbb-3(b)(1), unless the authorization is terminated or revoked.     Resp Syncytial Virus by PCR NEGATIVE NEGATIVE Final    Comment: (NOTE) Fact Sheet for Patients: BloggerCourse.com  Fact  Sheet for Healthcare Providers: SeriousBroker.it  This test is not yet approved or cleared by the [place] FDA and has been authorized for detection and/or diagnosis of SARS-CoV-2 by FDA under an Emergency Use Authorization (EUA). This EUA will remain in effect (meaning this test can be used) for the duration of the COVID-19 declaration under Section 564(b)(1) of the Act, 21 U.S.C. section 360bbb-3(b)(1), unless the authorization is terminated or revoked.  Performed at [facility], [address] [place], [place] [address]     Please note: You were cared for by a hospitalist during your hospital stay. Once you are discharged, your primary care physician will handle any further medical issues. Please note that NO REFILLS for any discharge medications will be authorized once you are discharged, as it is imperative that you return to your primary care physician (or establish a relationship with a primary care physician if you do not have one) for your post hospital discharge needs so that they can reassess your need for medications and monitor your lab values.    Time coordinating discharge: 40 minutes  SIGNED:   Burnadette Pop, MD  [facility] 12/27/2023, 10:34 AM Pager [phone]  If 7PM-7AM, please contact night-coverage [email or web address] Password [email or web address]

## 2023-12-27 NOTE — TOC Transition Note (Signed)
 Transition of Care [facility]) - Discharge Note   Patient Details  Name: Sydney Dalton MRN: [id number] Date of Birth: [birth year]  [facility] CM/SW Contact:  Lockie Pares, RN Phone Number: 12/27/2023, 11:03 AM   Clinical Narrative:     Patient is being discharged today. DC meds to TOC, no needs identified.     Final next level of care: Home/Self Care Barriers to Discharge: No Barriers Identified   Patient Goals and CMS Choice            Discharge Placement  Home self care                     Discharge Plan and Services Additional resources added to the After Visit Summary for                                       Social Drivers of Health (SDOH) Interventions SDOH Screenings   Food Insecurity: No Food Insecurity (12/25/2023)  Housing: Low Risk  (12/25/2023)  Transportation Needs: No Transportation Needs (12/25/2023)  Utilities: Not At Risk (12/25/2023)  Tobacco Use: High Risk (12/25/2023)     Readmission Risk Interventions     No data to display

## 2024-02-20 ENCOUNTER — Ambulatory Visit (INDEPENDENT_AMBULATORY_CARE_PROVIDER_SITE_OTHER): Admitting: Gastroenterology

## 2024-02-20 ENCOUNTER — Encounter: Payer: Self-pay | Admitting: Gastroenterology

## 2024-02-20 VITALS — BP 110/62 | HR 65 | Ht 65.0 in | Wt 122.0 lb

## 2024-02-20 DIAGNOSIS — Z8719 Personal history of other diseases of the digestive system: Secondary | ICD-10-CM

## 2024-02-20 DIAGNOSIS — D132 Benign neoplasm of duodenum: Secondary | ICD-10-CM | POA: Insufficient documentation

## 2024-02-20 DIAGNOSIS — K209 Esophagitis, unspecified without bleeding: Secondary | ICD-10-CM | POA: Insufficient documentation

## 2024-02-20 DIAGNOSIS — D649 Anemia, unspecified: Secondary | ICD-10-CM

## 2024-02-20 DIAGNOSIS — K449 Diaphragmatic hernia without obstruction or gangrene: Secondary | ICD-10-CM | POA: Diagnosis not present

## 2024-02-20 DIAGNOSIS — K8689 Other specified diseases of pancreas: Secondary | ICD-10-CM

## 2024-02-20 DIAGNOSIS — K861 Other chronic pancreatitis: Secondary | ICD-10-CM | POA: Diagnosis not present

## 2024-02-20 MED ORDER — OMEPRAZOLE 40 MG PO CPDR: 40.0000 mg | DELAYED_RELEASE_CAPSULE | Freq: Two times a day (BID) | ORAL | 3 refills | Status: DC

## 2024-02-20 NOTE — Progress Notes (Signed)
 02/20/2024 Sydney Dalton [id number] 06/27/1976   HISTORY OF PRESENT ILLNESS: This is a 48 year old female who is a patient of Dr. Marolyn Sis.  She has history of pancreatitis, alcohol related versus idiopathic.  Follow-up imaging showed chronic calcific pancreatitis.  Was recently hospitalized again in March for acute pancreatitis.  CT scan showed marked to moderate severity acute on chronic pancreatitis.  Was treated conservatively and improved.  Only hospitalized a couple of days.  She is here for hospital follow-up.  She tells me that she has not had any alcohol since she was in the hospital the last time.  She does feel much better than when she went to the hospital, but does continue to have some epigastric abdominal pain.  She says she is eating, trying to eat.  Not on any GI therapy despite previous findings of esophagitis.    Previous GI Evaluations  EUS June 2024 EGD impression: - No gross lesions in the proximal esophagus and in the mid esophagus. - LA Grade B esophagitis with no bleeding found distally. - Z-line irregular, 40 cm from the incisors. - 1 cm hiatal hernia. - Erythematous mucosa in the stomach. Biopsied. - Submucosal nodule found in the duodenum (contralateral to major papilla). Biopsied. - Submucosal nodule found in the duodenum (contralateral to major papilla). Biopsied. - Erythematous duodenopathy otherwise in visualized duodenum. EUS impression: - Pancreatic parenchymal abnormalities consisting of hyperechoic foci, lobularity and hyperechoic strands were noted in the entire pancreas. - The pancreatic duct had intraductal stones, had a prominently branched endosonographic appearance, had a tortuous/ectatic appearance and had hyperechoic walls in the pancreatic head, genu of the pancreas, body of the pancreas and tail of the pancreas. -Based on the findings at EUS, this patient has [place] criteria consistent with chronic pancreatitis. - There was no sign of significant  pathology in the common bile duct and in the common hepatic duct. - An intramural (subepithelial) lesion was found in the second portion of the duodenum. The lesion appeared to originate from within the deep mucosa (Layer 2). This corresponds with the second submucosal nodule in the duodenum noted above. - No malignant-appearing lymph nodes were visualized in the celiac region (level 20), peripancreatic region and porta hepatis region.     Colonoscopy June 2024 - Hemorrhoids found on digital rectal exam. - The examined portion of the ileum was normal. - One 4 mm polyp in the ascending colon, removed with a cold snare. Resected and retrieved. - Normal mucosa in the entire examined colon otherwise. - Non-bleeding non-thrombosed internal hemorrhoids.   A. STOMACH, BIOPSY:  - Gastric antral mucosa with mild nonspecific reactive gastropathy  - Gastric oxyntic mucosa with no specific histopathologic changes  - Helicobacter pylori-like organisms are not identified on routine HE  stain   B. DUODENAL NODULARITY, DISTAL, BIOPSY:  - Duodenal adenoma (low-grade dysplasia)  - Negative for high-grade dysplasia or carcinoma   C. DUODENAL NODULARITY, PROXIMAL, BIOPSY:  - Duodenal mucosa with no specific histopathologic changes  - Negative for increased intraepithelial lymphocytes or villous  architectural changes   D. COLON, ASCENDING, POLYPECTOMY:  - Tubular adenoma  - Negative for high-grade dysplasia or malignancy    Stool studies March 2024 Pancreatic elastase and qual fecal fat were normal    IgG subclass 4 normal, Triglycerides normal     Past Medical History:  Diagnosis Date   Pancreatitis    Past Surgical History:  Procedure Laterality Date   BIOPSY  03/19/2023   Procedure: BIOPSY;  Surgeon: Normie Becton., MD;  Location: [facility];  Service: Gastroenterology;;   COLONOSCOPY WITH PROPOFOL  N/A 03/19/2023   Procedure: COLONOSCOPY WITH PROPOFOL ;  Surgeon: Normie Becton., MD;  Location: [facility];  Service: Gastroenterology;  Laterality: N/A;   ESOPHAGOGASTRODUODENOSCOPY (EGD) WITH PROPOFOL  N/A 03/19/2023   Procedure: ESOPHAGOGASTRODUODENOSCOPY (EGD) WITH PROPOFOL ;  Surgeon: Brice Campi Albino Alu., MD;  Location: [facility];  Service: Gastroenterology;  Laterality: N/A;   EUS N/A 03/19/2023   Procedure: UPPER ENDOSCOPIC ULTRASOUND (EUS) RADIAL;  Surgeon: Normie Becton., MD;  Location: [facility];  Service: Gastroenterology;  Laterality: N/A;   POLYPECTOMY  03/19/2023   Procedure: POLYPECTOMY;  Surgeon: Mansouraty, Albino Alu., MD;  Location: [facility];  Service: Gastroenterology;;    reports that she has been smoking cigarettes. She has never been exposed to tobacco smoke. She does not have any smokeless tobacco history on file. She reports current alcohol use. She reports that she does not use drugs. family history includes Dementia in her mother. No Known Allergies    Outpatient Encounter Medications as of 02/20/2024  Medication Sig   folic acid  (FOLVITE ) 1 MG tablet Take 1 tablet (1 mg total) by mouth daily.   thiamine  (VITAMIN B1) 100 MG tablet Take 1 tablet (100 mg total) by mouth daily.   potassium chloride  (KLOR-CON ) 20 MEQ packet Dilute 2 packets in at least 8 ounces of cold water and take by mouth daily for 5 days.   No facility-administered encounter medications on file as of 02/20/2024.     REVIEW OF SYSTEMS  : All other systems reviewed and negative except where noted in the History of Present Illness.   PHYSICAL EXAM: BP 110/62   Pulse 65   Ht 5\' 5"  (1.651 m)   Wt 122 lb (55.3 kg)   BMI 20.30 kg/m  General: Well developed female in no acute distress Head: Normocephalic and atraumatic Eyes:  Sclerae anicteric, conjunctiva pink. Ears: Normal auditory acuity Lungs: Clear throughout to auscultation; no W/R/R. Heart: Regular rate and rhythm; no M/R/G. Abdomen: Soft, non-distended.  BS present.  Epigastric  TTP. Musculoskeletal: Symmetrical with no gross deformities  Skin: No lesions on visible extremities Extremities: No edema  Neurological: Alert oriented x 4, grossly non-focal Psychological:  Alert and cooperative. Normal mood and affect  ASSESSMENT AND PLAN: 48 y.o. year old female with acute on chronic pancreatitis with PD stones (not removed).   Negative prior workup for etiology of chronic pancreatitis but felt to be Etoh related vrs idiopathic. Acute flare in pancreatitis possibly Etoh related.  Previous pancreatic fecal elastase and fecal fat were normal. -Continue to avoid ETOH. -Low fat diet. -? Consider of pancreatic enzymes. -Dr. Brice Campi to consider PD stone removal or stent after ETOH abstinence at some point.   Chronic [place] anemia.  Hgb in 10-11 range. No overt GI bleeding. Has had prior EGD and colonoscopy    History of duodenal adenoma with low grade dysplasia.  -Plan was for repeat EGD to remove lesion so we will schedule that to be done at the hospital with Dr. Brice Campi, timing TBD once he can review and schedule.   Hiatal hernia / history of reflux esophagitis:  Not on any reflux medication, but was told to take omeprazole 40 mg twice daily for 8 weeks and 40 mg daily ongoing. -Will restart omeprazzole 40 mg BID for now.  Prescription sent to pharmacy.   CC:  No ref. provider found

## 2024-02-20 NOTE — Patient Instructions (Signed)
 We have sent the following medications to your pharmacy for you to pick up at your convenience: Omeprazole 40 mg twice daily 30-60 minutes before breakfast and dinner.   Dr. Brice Campi nurse will reach out within the next 7 days to schedule procedure.   _______________________________________________________  If your blood pressure at your visit was 140/90 or greater, please contact your primary care physician to follow up on this.  _______________________________________________________  If you are age 100 or older, your body mass index should be between 23-30. Your Body mass index is 20.3 kg/m. If this is out of the aforementioned range listed, please consider follow up with your Primary Care Provider.  If you are age 82 or younger, your body mass index should be between 19-25. Your Body mass index is 20.3 kg/m. If this is out of the aformentioned range listed, please consider follow up with your Primary Care Provider.   ________________________________________________________  The [facility] providers would like to encourage you to use MYCHART to communicate with providers for non-urgent requests or questions.  Due to long hold times on the telephone, sending your provider a message by [facility] may be a faster and more efficient way to get a response.  Please allow 48 business hours for a response.  Please remember that this is for non-urgent requests.  _______________________________________________________

## 2024-02-22 NOTE — Progress Notes (Signed)
 Attending Physician's Attestation   I have reviewed the chart.   I agree with the Advanced Practitioner's note, impression, and recommendations with any updates as below. May proceed with scheduling Upper EUS with EMR 75 minute case for duodenal adenoma resection attempt. Agree will need continued abstinence and episodes of pancreatitis to occur while being abstinent to consider role of increased risk Pancreatic ERCP.   Yong Henle, MD [facility] Office # [phone]

## 2024-02-24 ENCOUNTER — Telehealth: Payer: Self-pay

## 2024-02-24 ENCOUNTER — Other Ambulatory Visit: Payer: Self-pay

## 2024-02-24 DIAGNOSIS — D132 Benign neoplasm of duodenum: Secondary | ICD-10-CM

## 2024-02-24 NOTE — Telephone Encounter (Signed)
 Left message on machine to call back

## 2024-02-24 NOTE — Telephone Encounter (Signed)
 EUS EMR has been set up for 05/06/24 at 8 am at [facility] with GM

## 2024-02-24 NOTE — Telephone Encounter (Signed)
 Author: Mansouraty, Albino Alu., MD Service: Gastroenterology Author Type: Physician  Filed: 02/22/2024  7:14 AM Encounter Date: 02/20/2024 Status: Signed  Editor: Mansouraty, Albino Alu., MD (Physician)    Attending Physician's Attestation    I have reviewed the chart.    I agree with the Advanced Practitioner's note, impression, and recommendations with any updates as below. May proceed with scheduling Upper EUS with EMR 75 minute case for duodenal adenoma resection attempt. Agree will need continued abstinence and episodes of pancreatitis to occur while being abstinent to consider role of increased risk Pancreatic ERCP.     Yong Henle, MD [facility] Office # [phone]

## 2024-02-25 NOTE — Telephone Encounter (Signed)
 Left message on machine to call back

## 2024-02-26 NOTE — Telephone Encounter (Signed)
 Left message on machine to call back

## 2024-02-27 NOTE — Telephone Encounter (Signed)
EUS/EMR scheduled, pt instructed and medications reviewed.  Patient instructions mailed to home.  Patient to call with any questions or concerns.

## 2024-04-28 ENCOUNTER — Telehealth: Payer: Self-pay

## 2024-04-28 NOTE — Telephone Encounter (Signed)
 Procedure:ultrasound upper GI Procedure date: 05/06/24 Procedure location: [facility] Arrival Time: 6:30 Spoke with the patient Y/N: N Any prep concerns? N  Has the patient obtained the prep from the pharmacy ? N Do you have a care partner and transportation: N Any additional concerns? N  I made 3 attempts to contact patient and was unsuccessful however I did leave a detail message about the procedure and the office number.

## 2024-04-29 ENCOUNTER — Encounter (HOSPITAL_COMMUNITY): Payer: Self-pay | Admitting: Gastroenterology

## 2024-04-29 NOTE — Progress Notes (Signed)
 Attempted to obtain medical history for pre op call via telephone, unable to reach at this time. HIPAA compliant voicemail message left requesting return call to pre surgical testing department.

## 2024-05-04 NOTE — Telephone Encounter (Signed)
 Spoke with the patient. She was denied the time off by her employment for her procedure. She cannot come to have this done as was planned for on 05/06/24.  Central scheduling contacted. Patient was the next available date may be in October or November.

## 2024-05-04 NOTE — Telephone Encounter (Signed)
 Patient needing to rescheduled hospital procedure due to not being able to request off work.

## 2024-05-06 ENCOUNTER — Ambulatory Visit (HOSPITAL_COMMUNITY): Admission: RE | Admit: 2024-05-06 | Source: Home / Self Care | Admitting: Gastroenterology

## 2024-05-06 ENCOUNTER — Encounter (HOSPITAL_COMMUNITY): Admission: RE | Payer: Self-pay | Source: Home / Self Care

## 2024-05-06 SURGERY — ULTRASOUND, UPPER GI TRACT, ENDOSCOPIC
Anesthesia: Monitor Anesthesia Care

## 2024-05-06 NOTE — Telephone Encounter (Signed)
 EUS scheduled, pt instructed and medications reviewed.  Patient instructions mailed to home.  Patient to call with any questions or concerns.

## 2024-05-06 NOTE — Telephone Encounter (Signed)
 EUS has been rescheduled to 07/16/24 at 8 am at [facility] with GM

## 2024-07-08 ENCOUNTER — Telehealth: Payer: Self-pay | Admitting: Gastroenterology

## 2024-07-08 NOTE — Telephone Encounter (Addendum)
 Procedure:Upper EUS Procedure date: 07/16/24 Procedure location: [facility] Arrival Time: 6:30 am Spoke with the patient Y/N: Yes Any prep concerns? No  Has the patient obtained the prep from the pharmacy ? No prep needed Do you have a care partner and transportation: told pt to direct questions to the hospital when they call her. Any additional concerns? No

## 2024-07-09 ENCOUNTER — Encounter (HOSPITAL_COMMUNITY): Payer: Self-pay | Admitting: Gastroenterology

## 2024-07-16 ENCOUNTER — Encounter (HOSPITAL_COMMUNITY): Payer: Self-pay | Admitting: Anesthesiology

## 2024-07-16 ENCOUNTER — Encounter (HOSPITAL_COMMUNITY): Payer: Self-pay | Admitting: Gastroenterology

## 2024-07-16 ENCOUNTER — Encounter (HOSPITAL_COMMUNITY)
Admission: RE | Disposition: A | Discharge status: Home / Self Care | Payer: Self-pay | Source: Home / Self Care | Attending: Gastroenterology

## 2024-07-16 ENCOUNTER — Other Ambulatory Visit: Payer: Self-pay

## 2024-07-16 ENCOUNTER — Ambulatory Visit (HOSPITAL_COMMUNITY)
Admission: RE | Admit: 2024-07-16 | Discharge: 2024-07-16 | Disposition: A | Discharge status: Home / Self Care | Attending: Gastroenterology | Admitting: Gastroenterology

## 2024-07-16 ENCOUNTER — Ambulatory Visit (HOSPITAL_BASED_OUTPATIENT_CLINIC_OR_DEPARTMENT_OTHER): Payer: Self-pay | Admitting: Anesthesiology

## 2024-07-16 DIAGNOSIS — K319 Disease of stomach and duodenum, unspecified: Secondary | ICD-10-CM | POA: Insufficient documentation

## 2024-07-16 DIAGNOSIS — K861 Other chronic pancreatitis: Secondary | ICD-10-CM | POA: Insufficient documentation

## 2024-07-16 DIAGNOSIS — K8689 Other specified diseases of pancreas: Secondary | ICD-10-CM

## 2024-07-16 DIAGNOSIS — K3189 Other diseases of stomach and duodenum: Secondary | ICD-10-CM

## 2024-07-16 DIAGNOSIS — K2289 Other specified disease of esophagus: Secondary | ICD-10-CM

## 2024-07-16 DIAGNOSIS — F17200 Nicotine dependence, unspecified, uncomplicated: Secondary | ICD-10-CM | POA: Diagnosis not present

## 2024-07-16 DIAGNOSIS — D132 Benign neoplasm of duodenum: Secondary | ICD-10-CM | POA: Diagnosis present

## 2024-07-16 DIAGNOSIS — K2970 Gastritis, unspecified, without bleeding: Secondary | ICD-10-CM | POA: Diagnosis not present

## 2024-07-16 DIAGNOSIS — I899 Noninfective disorder of lymphatic vessels and lymph nodes, unspecified: Secondary | ICD-10-CM

## 2024-07-16 DIAGNOSIS — K838 Other specified diseases of biliary tract: Secondary | ICD-10-CM | POA: Insufficient documentation

## 2024-07-16 DIAGNOSIS — K219 Gastro-esophageal reflux disease without esophagitis: Secondary | ICD-10-CM | POA: Insufficient documentation

## 2024-07-16 DIAGNOSIS — K8681 Exocrine pancreatic insufficiency: Secondary | ICD-10-CM | POA: Diagnosis not present

## 2024-07-16 HISTORY — PX: ESOPHAGOGASTRODUODENOSCOPY: SHX5428

## 2024-07-16 HISTORY — PX: EUS: SHX5427

## 2024-07-16 SURGERY — ULTRASOUND, UPPER GI TRACT, ENDOSCOPIC
Anesthesia: Monitor Anesthesia Care

## 2024-07-16 MED ORDER — PROPOFOL 500 MG/50ML IV EMUL
INTRAVENOUS | Status: AC
  Filled 2024-07-16: qty 50

## 2024-07-16 MED ORDER — LIDOCAINE HCL (CARDIAC) PF 100 MG/5ML IV SOSY
PREFILLED_SYRINGE | INTRAVENOUS | Status: DC | PRN
  Administered 2024-07-16: 100 mg via INTRAVENOUS

## 2024-07-16 MED ORDER — PROPOFOL 10 MG/ML IV BOLUS
INTRAVENOUS | Status: DC | PRN
  Administered 2024-07-16 (×3): 30 mg via INTRAVENOUS
  Administered 2024-07-16: 40 mg via INTRAVENOUS
  Administered 2024-07-16 (×2): 20 mg via INTRAVENOUS
  Administered 2024-07-16: 30 mg via INTRAVENOUS
  Administered 2024-07-16: 20 mg via INTRAVENOUS
  Administered 2024-07-16 (×2): 30 mg via INTRAVENOUS
  Administered 2024-07-16 (×2): 20 mg via INTRAVENOUS
  Administered 2024-07-16: 30 mg via INTRAVENOUS

## 2024-07-16 MED ORDER — MIDAZOLAM HCL 2 MG/2ML IJ SOLN
INTRAMUSCULAR | Status: AC
  Filled 2024-07-16: qty 2

## 2024-07-16 MED ORDER — LIDOCAINE HCL (PF) 2 % IJ SOLN
INTRAMUSCULAR | Status: AC
  Filled 2024-07-16: qty 5

## 2024-07-16 MED ORDER — PROPOFOL 10 MG/ML IV BOLUS
INTRAVENOUS | Status: AC
  Filled 2024-07-16: qty 20

## 2024-07-16 MED ORDER — MIDAZOLAM HCL 2 MG/2ML IJ SOLN
INTRAMUSCULAR | Status: DC | PRN
  Administered 2024-07-16: 2 mg via INTRAVENOUS

## 2024-07-16 MED ORDER — PROPOFOL 1000 MG/100ML IV EMUL
INTRAVENOUS | Status: AC
Start: 2024-07-16 — End: 2024-07-16
  Filled 2024-07-16: qty 100

## 2024-07-16 MED ORDER — SODIUM CHLORIDE 0.9 % IV SOLN
INTRAVENOUS | Status: AC | PRN
  Administered 2024-07-16: 500 mL via INTRAMUSCULAR

## 2024-07-16 MED ORDER — PROPOFOL 500 MG/50ML IV EMUL
INTRAVENOUS | Status: DC | PRN
  Administered 2024-07-16: 125 ug/kg/min via INTRAVENOUS

## 2024-07-16 MED ORDER — SODIUM CHLORIDE 0.9 % IV SOLN: INTRAVENOUS | Status: DC

## 2024-07-16 MED ORDER — OMEPRAZOLE 40 MG PO CPDR: 40.0000 mg | DELAYED_RELEASE_CAPSULE | Freq: Every day | ORAL | 3 refills | Status: AC

## 2024-07-16 NOTE — Anesthesia Procedure Notes (Signed)
 Date/Time: 07/16/2024 8:10 AM  Performed by: Therisa Doyal CROME, CRNAOxygen Delivery Method: Simple face mask

## 2024-07-16 NOTE — Anesthesia Preprocedure Evaluation (Addendum)
 Anesthesia Evaluation  Patient identified by MRN, date of birth, ID band Patient awake    Reviewed: Allergy & Precautions, NPO status , Patient's Chart, lab work & pertinent test results  History of Anesthesia Complications Negative for: history of anesthetic complications  Airway Mallampati: II       Dental  (+) Dental Advisory Given, Teeth Intact   Pulmonary Current Smoker and Patient abstained from smoking.   breath sounds clear to auscultation       Cardiovascular  Rhythm:Regular Rate:Normal     Neuro/Psych    GI/Hepatic ,GERD  ,,(+)     substance abuse  alcohol use and marijuana useHx of Pancreatitis   Endo/Other    Renal/GU      Musculoskeletal   Abdominal   Peds  Hematology   Anesthesia Other Findings   Reproductive/Obstetrics                              Anesthesia Physical Anesthesia Plan  ASA: 2  Anesthesia Plan: MAC   Post-op Pain Management:    Induction: Intravenous  PONV Risk Score and Plan: 1  Airway Management Planned: Simple Face Mask  Additional Equipment:   Intra-op Plan:   Post-operative Plan:   Informed Consent:      Dental advisory given  Plan Discussed with:   Anesthesia Plan Comments:          Anesthesia Quick Evaluation

## 2024-07-16 NOTE — Op Note (Signed)
 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 Estimated Blood Loss:     Estimated blood loss was minimal. Procedure:                Pre-Anesthesia Assessment:                           - Prior to the procedure, a History and Physical                            was performed, and patient medications and                            allergies were reviewed. The patient's tolerance of                            previous anesthesia was also reviewed. The risks                            and benefits of the procedure and the sedation                            options and risks were discussed with the patient.                            All questions were answered, and informed consent                            was obtained. Prior Anticoagulants: The patient has                            taken no anticoagulant or antiplatelet agents. ASA  Grade Assessment: III - A patient with severe                            systemic disease. After reviewing the risks and                            benefits, the patient was deemed in satisfactory                            condition to undergo the procedure.                           After obtaining informed consent, the endoscope was                             passed under direct vision. Throughout the                            procedure, the patient's blood pressure, pulse, and                            oxygen saturations were monitored continuously. The                            [id number]-H190 ([id number]) Olympus endoscope was introduced                            through the mouth, and advanced to the second part                            of duodenum. The [id number]-[id number] ([id number]) Olympus                            duodenoscope was introduced through the mouth, and                            advanced to the area of papilla. The [id number]                            ([id number]) Olympus endosonoscope was introduced                            through the mouth, and advanced to the duodenum for                            ultrasound examination from the stomach and                            duodenum. The upper EUS was accomplished without                            difficulty. The patient tolerated the procedure.  The RADIAL EUS ( GF-[id number] ) [id number] was introduced                            through the mouth, and advanced to the duodenum for                            ultrasound examination from the stomach and                            duodenum. Scope In: Scope Out: Findings:      ENDOSCOPIC FINDING: :      White nummular lesions were noted in the entire esophagus. Biopsies were       taken with a cold forceps for histology .      The Z-line was irregular and was found 37 cm from the incisors.      Segmental mild inflammation characterized by erosions and erythema was       found in the gastric antrum.      No gross lesions were noted in the entire examined stomach. Biopsies       were taken with a cold forceps for histology and Helicobacter pylori       testing.      A medium-sized polypoid and sessile lesion, consistent with adenoma,       with no bleeding was found in the area of the minor papilla and        surrounding this in the 2nd portion of the duodenum (laterally to the       major papilla orifice) and it measures at least 25 mm in size. Biopsies       were taken with a cold forceps for histology.      The major papilla was normal (hidden under a hood).      No other gross lesions were noted in the duodenal bulb, in the first       portion of the duodenum and in the second portion of the duodenum.      ENDOSONOGRAPHIC FINDING: :      Endosonographic imaging of the pancreas showed sonographic changes       indicative of moderate-severe chronic pancreatitis in the entire       pancreas. The parenchyma had hyperechoic foci with shadowing, lobularity       without honeycombing and hyperechoic strands. The pancreatic duct had       intraductal calculi, irregularity of contour, upstream duct dilation       within the body and tail regions (where stones were noted) and a       hyperechoic duct margin throughout.      There was no sign of significant endosonographic abnormality in the       common bile duct and in the common hepatic duct. An unremarkable       gallbladder was identified.      Endosonographic imaging of the ampulla showed no intramural       (subepithelial) lesion.      Localized wall thickening was visualized endosonographically in the       region of the minor papilla. The a small pancreatic duct drained in this       region. This appeared to primarily be due to thickening within the       luminal interface/superficial mucosa (Layer 1) and deep mucosa (Layer  2). This is in region of presumed adenoma on endoscopic view as well.      Endosonographic imaging in the visualized portion of the liver showed no       mass.      No malignant-appearing lymph nodes were visualized in the celiac region       (level 20), peripancreatic region and porta hepatis region.      The celiac region was visualized. Impression:               EGD Impression:                           - White  nummular lesions in esophageal mucosa.                            Biopsied.                           - Z-line irregular, 37 cm from the incisors.                           - Antral gastritis. No other gross lesions in the                            entire stomach. Biopsied.                           - Suspected adenoma in the area of the minor                            papilla with it taking at least 25 mm of size                            overall (pancreatic secretions noted). Biopsied.                           - Normal major papilla (hidden under a hood).                           - No other gross lesions in the duodenal bulb, in                            the first portion of the duodenum and in the second                            portion of the duodenum.                           EUS Impression:                           - Endosonographic imaging of the pancreas showed                            sonographic changes consistent with moderate-severe  chronic pancreatitis. Pancreatic intraductal stones                            were present.                           - There was no sign of significant pathology in the                            common bile duct and in the common hepatic duct.                           - Wall thickening was seen in the area of the minor                            papilla. It appeared to primarily be within the                            luminal interface/superficial mucosa (Layer 1) and                            deep mucosa (Layer 2). Correlates with the region                            noted on endoscopic views.                           - The ampulla did not have any evidence of                            intramural lesion or thickening.                           - No malignant-appearing lymph nodes were                            visualized in the celiac region (level 20),                            peripancreatic  region and porta hepatis region. Moderate Sedation:      Not Applicable - Patient had care per Anesthesia. Recommendation:           - The patient Dalton be observed post-procedure,                            until all discharge criteria are met.                           - Discharge patient to home.                           - Patient has a contact number available for  emergencies. The signs and symptoms of potential                            delayed complications were discussed with the                            patient. Return to normal activities tomorrow.                            Written discharge instructions were provided to the                            patient.                           - Resume previous diet.                           - Observe patient's clinical course.                           - Await path results.                           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                           - If after discussion with other advanced                            endoscopist decision is made to do surveillance, we                            can continue that here, but I think reasonable for                            an attempt at resection elsewhere at this time.  Previous MRCP imaging did not show evidence of                            pancreatic divisum anatomy (not clear that is                            present here either today).                           - The findings and  recommendations were discussed                            with the patient.                           - The findings and recommendations were discussed                            with the designated responsible adult. Procedure Code(s):        --- Professional ---                           [phone], Esophagogastroduodenoscopy, flexible,                            transoral; with endoscopic ultrasound examination                            limited to the esophagus, stomach or duodenum, and                            adjacent structures                           43239, Esophagogastroduodenoscopy, flexible,                            transoral; with biopsy, single or multiple Diagnosis Code(s):        --- Professional ---                           K22.89, Other specified disease of esophagus                           K29.70, Gastritis, unspecified, without bleeding                           K86.1, Other chronic pancreatitis                           K31.89, Other diseases of stomach and duodenum                           I89.9, Noninfective disorder of lymphatic vessels                            and lymph  nodes, unspecified                           K31.7, Polyp of stomach and duodenum                           K83.8, Other specified diseases of biliary tract CPT copyright 2022 American Medical Association. All rights reserved. The codes documented in this report are preliminary and upon coder review may  be revised to meet current compliance requirements. Aloha Finner, MD 07/16/2024 9:24:12 AM Number of Addenda: 0

## 2024-07-16 NOTE — Anesthesia Postprocedure Evaluation (Signed)
 Anesthesia Post Note  Patient: Sydney Dalton  Procedure(s) Performed: ULTRASOUND, UPPER GI TRACT, ENDOSCOPIC RESECTION, MUCOSAL LESION, GI TRACT, ENDOSCOPIC EGD (ESOPHAGOGASTRODUODENOSCOPY)     Patient location during evaluation: Endoscopy Anesthesia Type: MAC Level of consciousness: awake Pain management: pain level controlled Vital Signs Assessment: post-procedure vital signs reviewed and stable Respiratory status: spontaneous breathing Cardiovascular status: blood pressure returned to baseline Postop Assessment: no apparent nausea or vomiting Anesthetic complications: no   No notable events documented.  Last Vitals:  Vitals:   07/16/24 0656  BP: (!) 148/90  Pulse: 72  Resp: (!) 25  Temp: 36.4 C  SpO2: 98%    Last Pain:  Vitals:   07/16/24 0656  TempSrc: Temporal  PainSc: 0-No pain                 Lauraine KATHEE [place]

## 2024-07-16 NOTE — Discharge Instructions (Signed)

## 2024-07-16 NOTE — H&P (Signed)
 GASTROENTEROLOGY PROCEDURE H&P NOTE   Primary Care Physician: Department, [facility]  HPI: Sydney Dalton is a 48 y.o. 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  Past Medical History:  Diagnosis Date   Pancreatitis    Past Surgical History:  Procedure Laterality Date   BIOPSY  03/19/2023   Procedure: BIOPSY;  Surgeon: Wilhelmenia Aloha Raddle., MD;  Location: [facility];  Service: Gastroenterology;;   COLONOSCOPY WITH PROPOFOL  N/A 03/19/2023   Procedure: COLONOSCOPY WITH PROPOFOL ;  Surgeon: Wilhelmenia Aloha Raddle., MD;  Location: [facility];  Service: Gastroenterology;  Laterality: N/A;   ESOPHAGOGASTRODUODENOSCOPY (EGD) WITH PROPOFOL  N/A 03/19/2023   Procedure: ESOPHAGOGASTRODUODENOSCOPY (EGD) WITH PROPOFOL ;  Surgeon: Wilhelmenia Aloha Raddle., MD;  Location: [facility];  Service: Gastroenterology;  Laterality: N/A;   EUS N/A 03/19/2023   Procedure: UPPER ENDOSCOPIC ULTRASOUND (EUS) RADIAL;  Surgeon: Wilhelmenia Aloha Raddle., MD;  Location: [facility];  Service: Gastroenterology;  Laterality: N/A;   POLYPECTOMY  03/19/2023   Procedure: POLYPECTOMY;  Surgeon: Mansouraty, Aloha Raddle., MD;  Location: [facility];  Service: Gastroenterology;;   Current Facility-Administered Medications  Medication Dose Route Frequency Provider Last Rate Last Admin   0.9 %  sodium chloride  infusion   Intravenous Continuous Mansouraty, Aloha Raddle., MD       0.9 %  sodium chloride  infusion    Continuous PRN Mansouraty, Aloha Raddle., MD 10 mL/hr at 07/16/24 0708 500 mL at 07/16/24 0708    Current Facility-Administered Medications:    0.9 %  sodium chloride  infusion, , Intravenous, Continuous, Mansouraty, Aloha Raddle., MD   0.9 %  sodium chloride  infusion, , , Continuous PRN, Mansouraty, Aloha Raddle., MD, Last Rate: 10 mL/hr at 07/16/24 0708, 500 mL at 07/16/24 0708 No Known Allergies Family History  Problem Relation Age of Onset    Dementia Mother    Colon cancer Neg Hx    Pancreatic cancer Neg Hx    Liver cancer Neg Hx    Esophageal cancer Neg Hx    Stomach cancer Neg Hx    Rectal cancer Neg Hx    Social History   Socioeconomic History   Marital status: Single    Spouse name: Not on file   Number of children: 1   Years of education: Not on file   Highest education level: Not on file  Occupational History   Occupation: Works at [employer]  Tobacco Use   Smoking status: Some Days    Types: Cigarettes    Passive exposure: Never   Smokeless tobacco: Not on file  Vaping Use   Vaping status: Never Used  Substance and Sexual Activity   Alcohol use: Yes    Comment: occ   Drug use: Never   Sexual activity: Not on file  Other Topics Concern   Not on file  Social History Narrative   Not on file   Social Drivers of Health   Financial Resource Strain: Not on file  Food Insecurity: No Food Insecurity (12/25/2023)   Hunger Vital Sign    Worried About Running Out of Food in the Last Year: Never true    Ran Out of Food in the Last Year: Never true  Transportation Needs: No Transportation Needs (12/25/2023)   PRAPARE - Administrator, Civil Service (Medical): No    Lack of Transportation (Non-Medical): No  Physical Activity: Not on file  Stress: Not on file  Social Connections: Not on file  Intimate Partner Violence: Not At  Risk (12/25/2023)   Humiliation, Afraid, Rape, and Kick questionnaire    Fear of Current or Ex-Partner: No    Emotionally Abused: No    Physically Abused: No    Sexually Abused: No    Physical Exam: Today's Vitals   07/16/24 0656  BP: (!) 148/90  Pulse: 72  Resp: (!) 25  Temp: 97.6 F (36.4 C)  TempSrc: Temporal  SpO2: 98%  Weight: 55.3 kg  Height: 5' 5 (1.651 m)  PainSc: 0-No pain   Body mass index is 20.29 kg/m. GEN: NAD EYE: Sclerae anicteric ENT: MMM CV: Non-tachycardic GI: Soft, NT/ND NEURO:  Alert & Oriented x 3  Lab Results: No results for  input(s): WBC, HGB, HCT, PLT in the last 72 hours. BMET No results for input(s): NA, K, CL, CO2, GLUCOSE, BUN, CREATININE, CALCIUM in the last 72 hours. LFT No results for input(s): PROT, ALBUMIN, AST, ALT, ALKPHOS, BILITOT, BILIDIR, IBILI in the last 72 hours. PT/INR No results for input(s): LABPROT, INR in the last 72 hours.   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  The risks of an EUS including intestinal perforation, bleeding, infection, aspiration, and medication effects were discussed as was the possibility it may not give a definitive diagnosis if a biopsy is performed.  When a biopsy of the pancreas is done as part of the EUS, there is an additional risk of pancreatitis at the rate of about 1-2%.  It was explained that procedure related pancreatitis is typically mild, although it can be severe and even life threatening, which is why we do not perform random pancreatic biopsies and only biopsy a lesion/area we feel is concerning enough to warrant the risk.   The risks and benefits of endoscopic evaluation/treatment were discussed with the patient and/or family; these include but are not limited to the risk of perforation, infection, bleeding, missed lesions, lack of diagnosis, severe illness requiring hospitalization, as well as anesthesia and sedation related illnesses.  The patient's history has been reviewed, patient examined, no change in status, and deemed stable for procedure.  The patient and/or family is agreeable to proceed.    Aloha Finner, MD [facility] Advanced [facility]oscopy Office # [phone]

## 2024-07-16 NOTE — Transfer of Care (Signed)
 Immediate Anesthesia Transfer of Care Note  Patient: Sydney Dalton  Procedure(s) Performed: ULTRASOUND, UPPER GI TRACT, ENDOSCOPIC RESECTION, MUCOSAL LESION, GI TRACT, ENDOSCOPIC EGD (ESOPHAGOGASTRODUODENOSCOPY)  Patient Location: Endoscopy Unit  Anesthesia Type:MAC  Level of Consciousness: awake  Airway & Oxygen Therapy: Patient Spontanous Breathing and Patient connected to face mask oxygen  Post-op Assessment: Report given to RN and Post -op Vital signs reviewed and stable  Post vital signs: Reviewed and stable  Last Vitals:  Vitals Value Taken Time  BP    Temp    Pulse    Resp    SpO2      Last Pain:  Vitals:   07/16/24 0656  TempSrc: Temporal  PainSc: 0-No pain         Complications: No notable events documented.

## 2024-07-17 ENCOUNTER — Encounter (HOSPITAL_COMMUNITY): Payer: Self-pay | Admitting: Gastroenterology

## 2024-07-17 LAB — SURGICAL PATHOLOGY

## 2024-07-22 ENCOUNTER — Ambulatory Visit: Payer: Self-pay | Admitting: Gastroenterology
